# Patient Record
Sex: Female | Born: 1940 | Race: White | Hispanic: No | Marital: Married | Smoking: Never smoker
Health system: Southern US, Community
[De-identification: ages and names within clinical notes are randomized; demographics above are authoritative.]

## PROBLEM LIST (undated history)

## (undated) DIAGNOSIS — I10 Essential (primary) hypertension: Secondary | ICD-10-CM

## (undated) DIAGNOSIS — K219 Gastro-esophageal reflux disease without esophagitis: Secondary | ICD-10-CM

## (undated) HISTORY — PX: KNEE SURGERY: SHX244

---

## 2007-12-26 ENCOUNTER — Ambulatory Visit: Payer: Self-pay | Admitting: Internal Medicine

## 2008-01-17 ENCOUNTER — Ambulatory Visit: Payer: Self-pay | Admitting: Internal Medicine

## 2008-06-25 ENCOUNTER — Ambulatory Visit: Payer: Self-pay | Admitting: Internal Medicine

## 2008-12-28 ENCOUNTER — Ambulatory Visit: Payer: Self-pay | Admitting: Internal Medicine

## 2009-12-30 ENCOUNTER — Ambulatory Visit: Payer: Self-pay | Admitting: Internal Medicine

## 2010-02-14 ENCOUNTER — Ambulatory Visit: Payer: Self-pay | Admitting: Internal Medicine

## 2011-01-21 ENCOUNTER — Ambulatory Visit: Payer: Self-pay | Admitting: Internal Medicine

## 2012-01-28 ENCOUNTER — Ambulatory Visit: Payer: Self-pay | Admitting: Internal Medicine

## 2013-02-02 ENCOUNTER — Ambulatory Visit: Payer: Self-pay | Admitting: Internal Medicine

## 2013-11-22 ENCOUNTER — Ambulatory Visit: Payer: Self-pay | Admitting: Unknown Physician Specialty

## 2013-12-13 ENCOUNTER — Ambulatory Visit: Payer: Self-pay | Admitting: Unknown Physician Specialty

## 2014-08-17 ENCOUNTER — Other Ambulatory Visit: Payer: Self-pay | Admitting: Internal Medicine

## 2014-08-17 DIAGNOSIS — Z1231 Encounter for screening mammogram for malignant neoplasm of breast: Secondary | ICD-10-CM

## 2014-08-23 ENCOUNTER — Ambulatory Visit
Admission: RE | Admit: 2014-08-23 | Discharge: 2014-08-23 | Disposition: A | Discharge status: Home / Self Care | Payer: Medicare Other | Source: Ambulatory Visit | Attending: Internal Medicine | Admitting: Internal Medicine

## 2014-08-23 DIAGNOSIS — Z1231 Encounter for screening mammogram for malignant neoplasm of breast: Secondary | ICD-10-CM | POA: Diagnosis not present

## 2015-09-06 ENCOUNTER — Other Ambulatory Visit: Payer: Self-pay | Admitting: Internal Medicine

## 2015-09-06 DIAGNOSIS — Z1231 Encounter for screening mammogram for malignant neoplasm of breast: Secondary | ICD-10-CM

## 2015-09-17 ENCOUNTER — Ambulatory Visit: Admission: RE | Admit: 2015-09-17 | Payer: Medicare Other | Source: Ambulatory Visit

## 2016-09-02 ENCOUNTER — Ambulatory Visit
Admission: RE | Admit: 2016-09-02 | Discharge: 2016-09-02 | Disposition: A | Discharge status: Home / Self Care | Payer: Medicare Other | Source: Ambulatory Visit | Attending: Internal Medicine | Admitting: Internal Medicine

## 2016-09-02 DIAGNOSIS — Z1231 Encounter for screening mammogram for malignant neoplasm of breast: Secondary | ICD-10-CM | POA: Diagnosis present

## 2020-08-24 ENCOUNTER — Other Ambulatory Visit: Payer: Self-pay

## 2020-08-24 ENCOUNTER — Emergency Department: Payer: Medicare Other

## 2020-08-24 ENCOUNTER — Emergency Department
Admission: EM | Admit: 2020-08-24 | Discharge: 2020-08-24 | Disposition: A | Discharge status: Home / Self Care | Payer: Medicare Other | Attending: Emergency Medicine | Admitting: Emergency Medicine

## 2020-08-24 DIAGNOSIS — R6883 Chills (without fever): Secondary | ICD-10-CM | POA: Insufficient documentation

## 2020-08-24 DIAGNOSIS — R309 Painful micturition, unspecified: Secondary | ICD-10-CM | POA: Diagnosis not present

## 2020-08-24 DIAGNOSIS — R112 Nausea with vomiting, unspecified: Secondary | ICD-10-CM | POA: Insufficient documentation

## 2020-08-24 DIAGNOSIS — R1031 Right lower quadrant pain: Secondary | ICD-10-CM

## 2020-08-24 DIAGNOSIS — N201 Calculus of ureter: Secondary | ICD-10-CM

## 2020-08-24 LAB — URINALYSIS, COMPLETE (UACMP) WITH MICROSCOPIC
Bacteria, UA: NONE SEEN
Bilirubin Urine: NEGATIVE
Glucose, UA: NEGATIVE mg/dL
Ketones, ur: 20 mg/dL — AB
Nitrite: NEGATIVE
Protein, ur: 30 mg/dL — AB
Specific Gravity, Urine: 1.014 (ref 1.005–1.030)
pH: 5 (ref 5.0–8.0)

## 2020-08-24 LAB — CBC WITH DIFFERENTIAL/PLATELET
Abs Immature Granulocytes: 0.02 10*3/uL (ref 0.00–0.07)
Basophils Absolute: 0 10*3/uL (ref 0.0–0.1)
Basophils Relative: 0 %
Eosinophils Absolute: 0 10*3/uL (ref 0.0–0.5)
Eosinophils Relative: 0 %
HCT: 44.1 % (ref 36.0–46.0)
Hemoglobin: 14.8 g/dL (ref 12.0–15.0)
Immature Granulocytes: 0 %
Lymphocytes Relative: 9 %
Lymphs Abs: 0.9 10*3/uL (ref 0.7–4.0)
MCH: 28.9 pg (ref 26.0–34.0)
MCHC: 33.6 g/dL (ref 30.0–36.0)
MCV: 86.1 fL (ref 80.0–100.0)
Monocytes Absolute: 0.4 10*3/uL (ref 0.1–1.0)
Monocytes Relative: 4 %
Neutro Abs: 8.4 10*3/uL — ABNORMAL HIGH (ref 1.7–7.7)
Neutrophils Relative %: 87 %
Platelets: 249 10*3/uL (ref 150–400)
RBC: 5.12 MIL/uL — ABNORMAL HIGH (ref 3.87–5.11)
RDW: 13 % (ref 11.5–15.5)
WBC: 9.8 10*3/uL (ref 4.0–10.5)
nRBC: 0 % (ref 0.0–0.2)

## 2020-08-24 LAB — COMPREHENSIVE METABOLIC PANEL
ALT: 27 U/L (ref 0–44)
AST: 30 U/L (ref 15–41)
Albumin: 4.4 g/dL (ref 3.5–5.0)
Alkaline Phosphatase: 53 U/L (ref 38–126)
Anion gap: 11 (ref 5–15)
BUN: 18 mg/dL (ref 8–23)
CO2: 24 mmol/L (ref 22–32)
Calcium: 9.7 mg/dL (ref 8.9–10.3)
Chloride: 103 mmol/L (ref 98–111)
Creatinine, Ser: 0.91 mg/dL (ref 0.44–1.00)
GFR, Estimated: >60 mL/min (ref >60)
Glucose, Bld: 147 mg/dL — ABNORMAL HIGH (ref 70–99)
Potassium: 3.3 mmol/L — ABNORMAL LOW (ref 3.5–5.1)
Sodium: 138 mmol/L (ref 135–145)
Total Bilirubin: 0.8 mg/dL (ref 0.3–1.2)
Total Protein: 7.5 g/dL (ref 6.5–8.1)

## 2020-08-24 LAB — LIPASE, BLOOD: Lipase: 35 U/L (ref 11–51)

## 2020-08-24 MED ORDER — OXYCODONE-ACETAMINOPHEN 5-325 MG PO TABS
1.0000 | ORAL_TABLET | Freq: Four times a day (QID) | ORAL | 0 refills | Status: DC | PRN
Start: 2020-08-24 — End: 2020-10-15

## 2020-08-24 MED ORDER — KETOROLAC TROMETHAMINE 30 MG/ML IJ SOLN
15.0000 mg | INTRAMUSCULAR | Status: AC
  Administered 2020-08-24: 15 mg via INTRAVENOUS
  Filled 2020-08-24: qty 1

## 2020-08-24 MED ORDER — MORPHINE SULFATE (PF) 4 MG/ML IV SOLN
4.0000 mg | Freq: Once | INTRAVENOUS | Status: AC
  Administered 2020-08-24: 4 mg via INTRAVENOUS
  Filled 2020-08-24: qty 1

## 2020-08-24 MED ORDER — TAMSULOSIN HCL 0.4 MG PO CAPS
0.4000 mg | ORAL_CAPSULE | ORAL | Status: AC
  Administered 2020-08-24: 0.4 mg via ORAL
  Filled 2020-08-24: qty 1

## 2020-08-24 MED ORDER — ONDANSETRON HCL 4 MG/2ML IJ SOLN
4.0000 mg | Freq: Once | INTRAMUSCULAR | Status: AC
  Administered 2020-08-24: 4 mg via INTRAVENOUS
  Filled 2020-08-24: qty 2

## 2020-08-24 MED ORDER — TAMSULOSIN HCL 0.4 MG PO CAPS: 0.4000 mg | ORAL_CAPSULE | Freq: Every day | ORAL | 0 refills | Status: AC

## 2020-08-24 MED ORDER — IOHEXOL 350 MG/ML SOLN
80.0000 mL | Freq: Once | INTRAVENOUS | Status: AC | PRN
  Administered 2020-08-24: 80 mL via INTRAVENOUS

## 2020-08-24 MED ORDER — LACTATED RINGERS IV BOLUS
1000.0000 mL | Freq: Once | INTRAVENOUS | Status: AC
  Administered 2020-08-24: 1000 mL via INTRAVENOUS

## 2020-08-24 NOTE — ED Provider Notes (Signed)
[facility] Emergency Department Provider Note   ____________________________________________   Event Date/Time   First MD Initiated Contact with Patient 08/24/20 1830     (approximate)  I have reviewed the triage vital signs and the nursing notes.   HISTORY  Chief Complaint Flank Pain    HPI Hannah Heath is a 80 y.o. female with past medical history of hypertension, hyperlipidemia, and diabetes who presents to the ED complaining of abdominal pain.  Patient reports that she has been dealing with intermittent pain in the right lower quadrant of her abdomen that radiates towards her groin for the past 6 days.  She describes pain as sharp, not exacerbated or alleviated by anything in particular.  It has been associated with nausea and multiple episodes of vomiting.  She also endorses burning when she urinates but she denies any hematuria.  She had intermittent pain in her right flank around the time of onset of symptoms, but pain now seems to have localized more to her right lower quadrant.  She denies any history of kidney stones, but was told by her PCP earlier this week that they thought it might be related to a kidney stone.  She endorses chills, but has not taken her temperature at home.  She has not had any cough, chest pain, or shortness of breath.        History reviewed. No pertinent past medical history.  There are no problems to display for this patient.   History reviewed. No pertinent surgical history.  Prior to Admission medications   Not on File    Allergies Patient has no allergy information on record.  Family History  Problem Relation Age of Onset   Breast cancer Neg Hx     Social History    Review of Systems  Constitutional: Positive for subjective fever/chills Eyes: No visual changes. ENT: No sore throat. Cardiovascular: Denies chest pain. Respiratory: Denies shortness of breath. Gastrointestinal: Positive for flank  and abdominal pain.  Positive for nausea and vomiting.  No diarrhea.  No constipation. Genitourinary: Negative for dysuria. Musculoskeletal: Negative for back pain. Skin: Negative for rash. Neurological: Negative for headaches, focal weakness or numbness.  ____________________________________________   PHYSICAL EXAM:  VITAL SIGNS: ED Triage Vitals  Enc Vitals Group     BP 08/24/20 1825 (!) 191/87     Pulse Rate 08/24/20 1825 68     Resp 08/24/20 1825 18     Temp 08/24/20 1825 98.2 F (36.8 C)     Temp src --      SpO2 08/24/20 1825 100 %     Weight --      Height --      Head Circumference --      Peak Flow --      Pain Score 08/24/20 1823 10     Pain Loc --      Pain Edu? --      Excl. in GC? --     Constitutional: Alert and oriented. Eyes: Conjunctivae are normal. Head: Atraumatic. Nose: No congestion/rhinnorhea. Mouth/Throat: Mucous membranes are moist. Neck: Normal ROM Cardiovascular: Normal rate, regular rhythm. Grossly normal heart sounds. Respiratory: Normal respiratory effort.  No retractions. Lungs CTAB. Gastrointestinal: Soft and tender to palpation in the right lower quadrant with no rebound or guarding.  No CVA tenderness bilaterally. No distention. Genitourinary: deferred Musculoskeletal: No lower extremity tenderness nor edema. Neurologic:  Normal speech and language. No gross focal neurologic deficits are appreciated. Skin:  Skin is  warm, dry and intact. No rash noted. Psychiatric: Mood and affect are normal. Speech and behavior are normal.  ____________________________________________   LABS (all labs ordered are listed, but only abnormal results are displayed)  Labs Reviewed  CBC WITH DIFFERENTIAL/PLATELET - Abnormal; Notable for the following components:      Result Value   RBC 5.12 (*)    Neutro Abs 8.4 (*)    All other components within normal limits  COMPREHENSIVE METABOLIC PANEL - Abnormal; Notable for the following components:    Potassium 3.3 (*)    Glucose, Bld 147 (*)    All other components within normal limits  LIPASE, BLOOD  URINALYSIS, COMPLETE (UACMP) WITH MICROSCOPIC    PROCEDURES  Procedure(s) performed (including Critical Care):  Procedures   ____________________________________________   INITIAL IMPRESSION / ASSESSMENT AND PLAN / ED COURSE      80 year old female with past medical history of hypertension, hyperlipidemia, diabetes presents to the ED complaining of intermittent pain in her right flank initially, has since moved down to the right lower quadrant of her abdomen and radiates towards her groin.  She has focal tenderness in the right lower quadrant of her abdomen we will further assess with CT scan for appendicitis versus nephrolithiasis.  I would also consider pyelonephritis given her dysuria, although she has no CVA tenderness.  Vital signs are reassuring and not consistent with sepsis.  We will hydrate with IV fluids and treat symptomatically with IV morphine and Zofran.  Patient turned over to oncoming provider pending CT results and UA results.      ____________________________________________   FINAL CLINICAL IMPRESSION(S) / ED DIAGNOSES  Final diagnoses:  Right lower quadrant abdominal pain     ED Discharge Orders     None        Note:  This document was prepared using Dragon voice recognition software and may include unintentional dictation errors.    Chesley Noon, MD 08/24/20 [phone]

## 2020-08-24 NOTE — ED Provider Notes (Signed)
Procedures     ----------------------------------------- 10:40 PM on 08/24/2020 ----------------------------------------- CT scan shows a 3 mm kidney stone in the distal right ureter.  Labs are okay, no AKI, no urinary tract infection.  After repeat dose of pain medicine she is feeling much better and eager to be discharged home.  Will prescribe Percocet and Flomax.  She will continue to use NSAIDs at home and follow-up with urology this week.  Return precautions discussed.     Sharman Cheek, MD 08/24/20 2241

## 2020-08-24 NOTE — ED Notes (Signed)
Dr. Larinda Buttery at bedside assessing pt at this time.

## 2020-08-24 NOTE — ED Triage Notes (Signed)
Pt come with c/o right flank pain. Pt states this started Monday. Pt states severe pain, N/V.

## 2020-10-14 ENCOUNTER — Other Ambulatory Visit: Payer: Self-pay | Admitting: *Deleted

## 2020-10-14 DIAGNOSIS — N200 Calculus of kidney: Secondary | ICD-10-CM

## 2020-10-15 ENCOUNTER — Other Ambulatory Visit
Admission: RE | Admit: 2020-10-15 | Discharge: 2020-10-15 | Disposition: A | Discharge status: Home / Self Care | Payer: Medicare Other | Source: Home / Self Care | Attending: Urology | Admitting: Urology

## 2020-10-15 ENCOUNTER — Ambulatory Visit
Admission: RE | Admit: 2020-10-15 | Discharge: 2020-10-15 | Disposition: A | Discharge status: Home / Self Care | Payer: Medicare Other | Attending: Urology | Admitting: Urology

## 2020-10-15 ENCOUNTER — Ambulatory Visit: Payer: Medicare Other | Admitting: Urology

## 2020-10-15 ENCOUNTER — Encounter: Payer: Self-pay | Admitting: Urology

## 2020-10-15 ENCOUNTER — Ambulatory Visit: Payer: Self-pay | Admitting: Urology

## 2020-10-15 ENCOUNTER — Ambulatory Visit
Admission: RE | Admit: 2020-10-15 | Discharge: 2020-10-15 | Disposition: A | Discharge status: Home / Self Care | Payer: Medicare Other | Source: Ambulatory Visit | Attending: Urology | Admitting: Urology

## 2020-10-15 ENCOUNTER — Other Ambulatory Visit: Payer: Self-pay

## 2020-10-15 DIAGNOSIS — N200 Calculus of kidney: Secondary | ICD-10-CM

## 2020-10-15 LAB — URINALYSIS, COMPLETE (UACMP) WITH MICROSCOPIC
Bilirubin Urine: NEGATIVE
Glucose, UA: NEGATIVE mg/dL
Leukocytes,Ua: NEGATIVE
Nitrite: NEGATIVE
Protein, ur: NEGATIVE mg/dL
Specific Gravity, Urine: 1.02 (ref 1.005–1.030)
pH: 5 (ref 5.0–8.0)

## 2020-10-15 NOTE — Progress Notes (Signed)
   10/15/20 1:07 PM   Lambert Keto Nov 22, 1940 [id number]  CC: Nephrolithiasis, possible bladder lesion  HPI: 80 year old female with history of kidney stones who presented to the ER on 08/24/2020 with right-sided flank pain and nausea.  I personally viewed and interpreted the CT that showed a 3 mm right distal ureteral stone with severe upstream hydronephrosis, as well as mild left hydronephrosis secondary to a small 1 to 2 mm left distal ureteral stone.  This was not commented on the original CT report.  The CT also comments on some thickening of the right sided bladder wall of unclear etiology.  Within a few days of that ER visit her pain totally resolved.  She never saw a stone pass.  She denies any hematuria or urinary symptoms.  Urinalysis today is completely benign.  She continues to urinate without issue.  PMH: No past medical history on file.  Surgical History: No past surgical history on file.     Family History: Family History  Problem Relation Age of Onset   Breast cancer Neg Hx     Social History:  reports that she has never smoked. She has never used smokeless tobacco. She reports that she does not drink alcohol and does not use drugs.  Physical Exam:  Constitutional:  Alert and oriented, No acute distress. Cardiovascular: No clubbing, cyanosis, or edema. Respiratory: Normal respiratory effort, no increased work of breathing. GI: Abdomen is soft, nontender, nondistended, no abdominal masses   Laboratory Data: Reviewed, see HPI  Pertinent Imaging: I have personally viewed and interpreted the CT showing a 3 mm right distal ureteral stone with upstream hydronephrosis, as well as a smaller 2 mm left distal ureteral stone with upstream hydronephrosis.  KUB today with multiple phleboliths, but no definite evidence of ureteral stone.  Assessment & Plan:   80 year old female with likely spontaneous passage of bilateral ureteral stones 6 weeks ago.  She is  asymptomatic today and urinalysis is completely benign.  I recommended a renal ultrasound to confirm resolution of hydronephrosis as she was not having significant pain on the left side originally, and we will call with those results.  Regarding the soft tissue thickening of the posterior bladder wall on the right side this almost certainly represents edema/inflammation from the acute right distal ureteral stone, and will also be reexamined at the follow-up bladder ultrasound.  Renal/bladder ultrasound, call with results-> if normal can follow-up with urology as needed   Legrand Rams, MD 10/15/2020  [facility] [address] [place], [place] [address] [phone]

## 2020-10-15 NOTE — Patient Instructions (Signed)
Dietary Guidelines to Help Prevent Kidney Stones Kidney stones are deposits of minerals and salts that form inside your kidneys. Your risk of developing kidney stones may be greater depending on your diet, your lifestyle, the medicines you take, and whether you have certain medical conditions. Most people can lower their chances of developing kidney stones by following the instructions below. Your dietitian may give you more specific instructions depending on your overall health and the type of kidney stones you tend to develop. What are tips for following this plan? Reading food labels  Choose foods with "no salt added" or "low-salt" labels. Limit your salt (sodium) intake to less than 1,500 mg a day. Choose foods with calcium for each meal and snack. Try to eat about 300 mg of calcium at each meal. Foods that contain 200-500 mg of calcium a serving include: 8 oz (237 mL) of milk, calcium-fortifiednon-dairy milk, and calcium-fortifiedfruit juice. Calcium-fortified means that calcium has been added to these drinks. 8 oz (237 mL) of kefir, yogurt, and soy yogurt. 4 oz (114 g) of tofu. 1 oz (28 g) of cheese. 1 cup (150 g) of dried figs. 1 cup (91 g) of cooked broccoli. One 3 oz (85 g) can of sardines or mackerel. Most people need 1,000-1,500 mg of calcium a day. Talk to your dietitian about how much calcium is recommended for you. Shopping Buy plenty of fresh fruits and vegetables. Most people do not need to avoid fruits and vegetables, even if these foods contain nutrients that may contribute to kidney stones. When shopping for convenience foods, choose: Whole pieces of fruit. Pre-made salads with dressing on the side. Low-fat fruit and yogurt smoothies. Avoid buying frozen meals or prepared deli foods. These can be high in sodium. Look for foods with live cultures, such as yogurt and kefir. Choose high-fiber grains, such as whole-wheat breads, oat bran, and wheat cereals. Cooking Do not add  salt to food when cooking. Place a salt shaker on the table and allow each person to add his or her own salt to taste. Use vegetable protein, such as beans, textured vegetable protein (TVP), or tofu, instead of meat in pasta, casseroles, and soups. Meal planning Eat less salt, if told by your dietitian. To do this: Avoid eating processed or pre-made food. Avoid eating fast food. Eat less animal protein, including cheese, meat, poultry, or fish, if told by your dietitian. To do this: Limit the number of times you have meat, poultry, fish, or cheese each week. Eat a diet free of meat at least 2 days a week. Eat only one serving each day of meat, poultry, fish, or seafood. When you prepare animal protein, cut pieces into small portion sizes. For most meat and fish, one serving is about the size of the palm of your hand. Eat at least five servings of fresh fruits and vegetables each day. To do this: Keep fruits and vegetables on hand for snacks. Eat one piece of fruit or a handful of berries with breakfast. Have a salad and fruit at lunch. Have two kinds of vegetables at dinner. Limit foods that are high in a substance called oxalate. These include: Spinach (cooked), rhubarb, beets, sweet potatoes, and Swiss chard. Peanuts. Potato chips, french fries, and baked potatoes with skin on. Nuts and nut products. Chocolate. If you regularly take a diuretic medicine, make sure to eat at least 1 or 2 servings of fruits or vegetables that are high in potassium each day. These include: Avocado. Banana. Orange, prune,   carrot, or tomato juice. Baked potato. Cabbage. Beans and split peas. Lifestyle  Drink enough fluid to keep your urine pale yellow. This is the most important thing you can do. Spread your fluid intake throughout the day. If you drink alcohol: Limit how much you use to: 0-1 drink a day for women who are not pregnant. 0-2 drinks a day for men. Be aware of how much alcohol is in your  drink. In the [place] one drink equals one 12 oz bottle of beer (355 mL), one 5 oz glass of wine (148 mL), or one 1 oz glass of hard liquor (44 mL). Lose weight if told by your health care provider. Work with your dietitian to find an eating plan and weight loss strategies that work best for you. General information Talk to your health care provider and dietitian about taking daily supplements. You may be told the following depending on your health and the cause of your kidney stones: Not to take supplements with vitamin C. To take a calcium supplement. To take a daily probiotic supplement. To take other supplements such as magnesium, fish oil, or vitamin B6. Take over-the-counter and prescription medicines only as told by your health care provider. These include supplements. What foods should I limit? Limit your intake of the following foods, or eat them as told by your dietitian. Vegetables Spinach. Rhubarb. Beets. Canned vegetables. Pickles. Olives. Baked potatoes with skin. Grains Wheat bran. Baked goods. Salted crackers. Cereals high in sugar. Meats and other proteins Nuts. Nut butters. Large portions of meat, poultry, or fish. Salted, precooked, or cured meats, such as sausages, meat loaves, and hot dogs. Dairy Cheese. Beverages Regular soft drinks. Regular vegetable juice. Seasonings and condiments Seasoning blends with salt. Salad dressings. Soy sauce. Ketchup. Barbecue sauce. Other foods Canned soups. Canned pasta sauce. Casseroles. Pizza. Lasagna. Frozen meals. Potato chips. French fries. The items listed above may not be a complete list of foods and beverages you should limit. Contact a dietitian for more information. What foods should I avoid? Talk to your dietitian about specific foods you should avoid based on the type of kidney stones you have and your overall health. Fruits Grapefruit. The item listed above may not be a complete list of foods and beverages you should  avoid. Contact a dietitian for more information. Summary Kidney stones are deposits of minerals and salts that form inside your kidneys. You can lower your risk of kidney stones by making changes to your diet. The most important thing you can do is drink enough fluid. Drink enough fluid to keep your urine pale yellow. Talk to your dietitian about how much calcium you should have each day, and eat less salt and animal protein as told by your dietitian. This information is not intended to replace advice given to you by your health care provider. Make sure you discuss any questions you have with your health care provider. Document Revised: 12/22/2018 Document Reviewed: 12/22/2018 Elsevier Patient Education  2022 Elsevier Inc.  

## 2020-11-08 ENCOUNTER — Ambulatory Visit
Admission: RE | Admit: 2020-11-08 | Discharge: 2020-11-08 | Disposition: A | Discharge status: Home / Self Care | Payer: Medicare Other | Source: Ambulatory Visit | Attending: Urology | Admitting: Urology

## 2020-11-08 ENCOUNTER — Other Ambulatory Visit: Payer: Self-pay

## 2020-11-08 DIAGNOSIS — N200 Calculus of kidney: Secondary | ICD-10-CM | POA: Insufficient documentation

## 2020-11-12 ENCOUNTER — Telehealth: Payer: Self-pay

## 2020-11-12 NOTE — Telephone Encounter (Signed)
-----   Message from Sondra Come, MD sent at 11/12/2020  8:19 AM EDT ----- Good news, kidney and bladder US normal, can follow up with urology as needed  Legrand Rams, MD 11/12/2020

## 2020-11-12 NOTE — Telephone Encounter (Signed)
Called pt informed her of the information below. Pt voiced understanding.  

## 2021-07-07 ENCOUNTER — Other Ambulatory Visit: Payer: Self-pay | Admitting: Internal Medicine

## 2021-07-07 DIAGNOSIS — Z1231 Encounter for screening mammogram for malignant neoplasm of breast: Secondary | ICD-10-CM

## 2021-07-18 ENCOUNTER — Other Ambulatory Visit: Payer: Self-pay | Admitting: Internal Medicine

## 2021-07-18 DIAGNOSIS — E2839 Other primary ovarian failure: Secondary | ICD-10-CM

## 2021-07-20 LAB — COLOGUARD: COLOGUARD: NEGATIVE

## 2021-08-15 ENCOUNTER — Other Ambulatory Visit: Payer: Medicare Other

## 2022-01-08 ENCOUNTER — Encounter: Payer: Self-pay | Admitting: Ophthalmology

## 2022-01-19 NOTE — Discharge Instructions (Signed)

## 2022-01-20 ENCOUNTER — Ambulatory Visit
Admission: RE | Admit: 2022-01-20 | Discharge: 2022-01-20 | Disposition: A | Discharge status: Home / Self Care | Payer: Medicare Other | Attending: Ophthalmology | Admitting: Ophthalmology

## 2022-01-20 ENCOUNTER — Ambulatory Visit: Payer: Medicare Other | Admitting: Anesthesiology

## 2022-01-20 ENCOUNTER — Encounter: Payer: Self-pay | Admitting: Ophthalmology

## 2022-01-20 ENCOUNTER — Encounter
Admission: RE | Disposition: A | Discharge status: Home / Self Care | Payer: Self-pay | Source: Home / Self Care | Attending: Ophthalmology

## 2022-01-20 ENCOUNTER — Other Ambulatory Visit: Payer: Self-pay

## 2022-01-20 DIAGNOSIS — K219 Gastro-esophageal reflux disease without esophagitis: Secondary | ICD-10-CM | POA: Diagnosis not present

## 2022-01-20 DIAGNOSIS — Z79899 Other long term (current) drug therapy: Secondary | ICD-10-CM | POA: Diagnosis not present

## 2022-01-20 DIAGNOSIS — I10 Essential (primary) hypertension: Secondary | ICD-10-CM | POA: Insufficient documentation

## 2022-01-20 DIAGNOSIS — H2511 Age-related nuclear cataract, right eye: Secondary | ICD-10-CM | POA: Diagnosis not present

## 2022-01-20 HISTORY — DX: Essential (primary) hypertension: I10

## 2022-01-20 HISTORY — PX: CATARACT EXTRACTION W/PHACO: SHX586

## 2022-01-20 HISTORY — DX: Gastro-esophageal reflux disease without esophagitis: K21.9

## 2022-01-20 SURGERY — PHACOEMULSIFICATION, CATARACT, WITH IOL INSERTION
Anesthesia: Monitor Anesthesia Care | Site: Eye | Laterality: Right

## 2022-01-20 MED ORDER — FENTANYL CITRATE (PF) 100 MCG/2ML IJ SOLN
INTRAMUSCULAR | Status: DC | PRN
  Administered 2022-01-20: 50 ug via INTRAVENOUS

## 2022-01-20 MED ORDER — SIGHTPATH DOSE#1 BSS IO SOLN
INTRAOCULAR | Status: DC | PRN
  Administered 2022-01-20: 2 mL

## 2022-01-20 MED ORDER — FENTANYL CITRATE PF 50 MCG/ML IJ SOSY: 25.0000 ug | PREFILLED_SYRINGE | INTRAMUSCULAR | Status: DC | PRN

## 2022-01-20 MED ORDER — LACTATED RINGERS IV SOLN: INTRAVENOUS | Status: DC

## 2022-01-20 MED ORDER — SIGHTPATH DOSE#1 BSS IO SOLN
INTRAOCULAR | Status: DC | PRN
  Administered 2022-01-20: 94 mL via OPHTHALMIC

## 2022-01-20 MED ORDER — MIDAZOLAM HCL 2 MG/2ML IJ SOLN
INTRAMUSCULAR | Status: DC | PRN
  Administered 2022-01-20: 1 mg via INTRAVENOUS

## 2022-01-20 MED ORDER — ONDANSETRON HCL 4 MG/2ML IJ SOLN: 4.0000 mg | Freq: Once | INTRAMUSCULAR | Status: DC | PRN

## 2022-01-20 MED ORDER — ARMC OPHTHALMIC DILATING DROPS
1.0000 | OPHTHALMIC | Status: DC | PRN
  Administered 2022-01-20 (×3): 1 via OPHTHALMIC

## 2022-01-20 MED ORDER — TETRACAINE HCL 0.5 % OP SOLN
1.0000 [drp] | OPHTHALMIC | Status: DC | PRN
  Administered 2022-01-20 (×3): 1 [drp] via OPHTHALMIC

## 2022-01-20 MED ORDER — MOXIFLOXACIN HCL 0.5 % OP SOLN
OPHTHALMIC | Status: DC | PRN
  Administered 2022-01-20: .2 mL via OPHTHALMIC

## 2022-01-20 MED ORDER — BRIMONIDINE TARTRATE-TIMOLOL 0.2-0.5 % OP SOLN
OPHTHALMIC | Status: DC | PRN
  Administered 2022-01-20: 1 [drp] via OPHTHALMIC

## 2022-01-20 MED ORDER — SIGHTPATH DOSE#1 NA CHONDROIT SULF-NA HYALURON 40-17 MG/ML IO SOLN
INTRAOCULAR | Status: DC | PRN
  Administered 2022-01-20: 1 mL via INTRAOCULAR

## 2022-01-20 MED ORDER — SIGHTPATH DOSE#1 BSS IO SOLN
INTRAOCULAR | Status: DC | PRN
  Administered 2022-01-20: 15 mL via INTRAOCULAR

## 2022-01-20 SURGICAL SUPPLY — 11 items
CANNULA ANT/CHMB 27G (MISCELLANEOUS) IMPLANT
CANNULA ANT/CHMB 27GA (MISCELLANEOUS) IMPLANT
CATARACT SUITE SIGHTPATH (MISCELLANEOUS) ×1 IMPLANT
FEE CATARACT SUITE SIGHTPATH (MISCELLANEOUS) ×1 IMPLANT
GLOVE SURG ENC TEXT LTX SZ8 (GLOVE) ×1 IMPLANT
GLOVE SURG TRIUMPH 8.0 PF LTX (GLOVE) ×1 IMPLANT
LENS IOL TECNIS EYHANCE 16.5 (Intraocular Lens) IMPLANT
NDL FILTER BLUNT 18X1 1/2 (NEEDLE) ×1 IMPLANT
NEEDLE FILTER BLUNT 18X1 1/2 (NEEDLE) ×1 IMPLANT
SYR 3ML LL SCALE MARK (SYRINGE) ×1 IMPLANT
WATER STERILE IRR 250ML POUR (IV SOLUTION) ×1 IMPLANT

## 2022-01-20 NOTE — H&P (Signed)
[facility]   Primary Care Physician:  Albina Billet, MD Ophthalmologist: Dr. George Ina  Pre-Procedure History & Physical: HPI:  Hannah Heath is a 82 y.o. female here for cataract surgery.   Past Medical History:  Diagnosis Date   GERD (gastroesophageal reflux disease)    Hypertension     Past Surgical History:  Procedure Laterality Date   KNEE SURGERY Right     Prior to Admission medications   Medication Sig Start Date End Date Taking? Authorizing Provider  cetirizine (ZYRTEC) 10 MG tablet Take 5 mg by mouth daily.   Yes [provider]  diltiazem (CARDIZEM CD) 240 MG 24 hr capsule Take 240 mg by mouth daily. 09/20/20  Yes [provider]  fluticasone (FLONASE) 50 MCG/ACT nasal spray SMARTSIG:1 Both Nares Daily PRN 10/07/20  Yes [provider]  ibuprofen (ADVIL) 200 MG tablet Take 400 mg by mouth at bedtime.   Yes [provider]  lisinopril (ZESTRIL) 40 MG tablet Take 40 mg by mouth daily. 09/23/20  Yes [provider]  Melatonin 10 MG TABS Take by mouth at bedtime.   Yes [provider]  Multiple Vitamin (MULTIVITAMIN) tablet Take 1 tablet by mouth daily. Nature's truth   Yes [provider]  omeprazole (PRILOSEC) 20 MG capsule Take 20 mg by mouth daily.   Yes [provider]  pravastatin (PRAVACHOL) 40 MG tablet Take 40 mg by mouth daily as needed. 09/20/20  Yes [provider]    Allergies as of 12/15/2021 - Review Complete 10/15/2020  Allergen Reaction Noted   Codeine Itching 05/31/2013    Family History  Problem Relation Age of Onset   Breast cancer Neg Hx     Social History   Socioeconomic History   Marital status: Married    Spouse name: Not on file   Number of children: Not on file   Years of education: Not on file   Highest education level: Not on file  Occupational History   Not on file  Tobacco Use   Smoking status: Never   Smokeless tobacco: Never  Vaping Use    Vaping Use: Never used  Substance and Sexual Activity   Alcohol use: Never   Drug use: Never   Sexual activity: Not Currently  Other Topics Concern   Not on file  Social History Narrative   Not on file   Social Determinants of Health   Financial Resource Strain: Not on file  Food Insecurity: Not on file  Transportation Needs: Not on file  Physical Activity: Not on file  Stress: Not on file  Social Connections: Not on file  Intimate Partner Violence: Not on file    Review of Systems: See HPI, otherwise negative ROS  Physical Exam: BP (!) 165/94   Pulse 70   Temp (!) 97.4 F (36.3 C) (Temporal)   Resp 16   Ht 4\' 11"  (1.499 m)   Wt 72.4 kg   SpO2 97%   BMI 32.26 kg/m  General:   Alert, cooperative in NAD Head:  Normocephalic and atraumatic. Respiratory:  Normal work of breathing. Cardiovascular:  RRR  Impression/Plan: Hannah Heath is here for cataract surgery.  Risks, benefits, limitations, and alternatives regarding cataract surgery have been reviewed with the patient.  Questions have been answered.  All parties agreeable.   Birder Robson, MD  01/20/2022, 9:08 AM

## 2022-01-20 NOTE — Transfer of Care (Signed)
Immediate Anesthesia Transfer of Care Note  Patient: Hannah Heath  Procedure(s) Performed: CATARACT EXTRACTION PHACO AND INTRAOCULAR LENS PLACEMENT (IOC) RIGHT 27.55 02:16.7 (Right: Eye)  Patient Location: PACU  Anesthesia Type:MAC  Level of Consciousness: awake, alert , and oriented  Airway & Oxygen Therapy: Patient Spontanous Breathing  Post-op Assessment: Report given to RN  Post vital signs: Reviewed and stable  Last Vitals:  Vitals Value Taken Time  BP 144/70 01/20/22 0938  Temp 36.3 C 01/20/22 0938  Pulse 70 01/20/22 0940  Resp 17 01/20/22 0940  SpO2 96 % 01/20/22 0940  Vitals shown include unvalidated device data.  Last Pain:  Vitals:   01/20/22 0938  TempSrc:   PainSc: 0-No pain      Patients Stated Pain Goal: 0 (62/56/38 9373)  Complications: No notable events documented.

## 2022-01-20 NOTE — Anesthesia Preprocedure Evaluation (Signed)
Anesthesia Evaluation  Patient identified by MRN, date of birth, ID band Patient awake    Reviewed: Allergy & Precautions, H&P , NPO status , Patient's Chart, lab work & pertinent test results, reviewed documented beta blocker date and time   Airway Mallampati: II  TM Distance: >3 FB Neck ROM: full    Dental no notable dental hx. (+) Teeth Intact   Pulmonary neg pulmonary ROS   Pulmonary exam normal breath sounds clear to auscultation       Cardiovascular Exercise Tolerance: Good hypertension, On Medications negative cardio ROS  Rhythm:regular Rate:Normal     Neuro/Psych negative neurological ROS  negative psych ROS   GI/Hepatic Neg liver ROS,GERD  Medicated,,  Endo/Other  negative endocrine ROSdiabetes, Well Controlled    Renal/GU      Musculoskeletal   Abdominal   Peds  Hematology negative hematology ROS (+)   Anesthesia Other Findings   Reproductive/Obstetrics negative OB ROS                             Anesthesia Physical Anesthesia Plan  ASA: 2  Anesthesia Plan: MAC   Post-op Pain Management:    Induction:   PONV Risk Score and Plan:   Airway Management Planned:   Additional Equipment:   Intra-op Plan:   Post-operative Plan:   Informed Consent: I have reviewed the patients History and Physical, chart, labs and discussed the procedure including the risks, benefits and alternatives for the proposed anesthesia with the patient or authorized representative who has indicated his/her understanding and acceptance.       Plan Discussed with: CRNA  Anesthesia Plan Comments:        Anesthesia Quick Evaluation  

## 2022-01-20 NOTE — Anesthesia Postprocedure Evaluation (Signed)
Anesthesia Post Note  Patient: Hannah Heath  Procedure(s) Performed: CATARACT EXTRACTION PHACO AND INTRAOCULAR LENS PLACEMENT (IOC) RIGHT 27.55 02:16.7 (Right: Eye)  Patient location during evaluation: PACU Anesthesia Type: MAC Level of consciousness: awake and alert Pain management: pain level controlled Vital Signs Assessment: post-procedure vital signs reviewed and stable Respiratory status: spontaneous breathing, nonlabored ventilation, respiratory function stable and patient connected to nasal cannula oxygen Cardiovascular status: stable and blood pressure returned to baseline Postop Assessment: no apparent nausea or vomiting Anesthetic complications: no   No notable events documented.   Last Vitals:  Vitals:   01/20/22 0938 01/20/22 0942  BP: (!) 144/70 (!) 143/80  Pulse: 65 65  Resp: 15 20  Temp: (!) 36.3 C (!) 36.3 C  SpO2: 97% 95%    Last Pain:  Vitals:   01/20/22 0942  TempSrc:   PainSc: 0-No pain                 Molli Barrows

## 2022-01-20 NOTE — Op Note (Signed)
PREOPERATIVE DIAGNOSIS:  Nuclear sclerotic cataract of the right eye.   POSTOPERATIVE DIAGNOSIS:  Cataract   OPERATIVE PROCEDURE:ORPROCALL@   SURGEON:  Birder Robson, MD.   ANESTHESIA:  Anesthesiologist: Molli Barrows, MD  1.      Managed anesthesia care. 2.      0.47ml of Shugarcaine was instilled in the eye following the paracentesis.   COMPLICATIONS:  None.   TECHNIQUE:   Stop and chop   DESCRIPTION OF PROCEDURE:  The patient was examined and consented in the preoperative holding area where the aforementioned topical anesthesia was applied to the right eye and then brought back to the Operating Room where the right eye was prepped and draped in the usual sterile ophthalmic fashion and a lid speculum was placed. A paracentesis was created with the side port blade and the anterior chamber was filled with viscoelastic. A near clear corneal incision was performed with the steel keratome. A continuous curvilinear capsulorrhexis was performed with a cystotome followed by the capsulorrhexis forceps. Hydrodissection and hydrodelineation were carried out with BSS on a blunt cannula. The lens was removed in a stop and chop  technique and the remaining cortical material was removed with the irrigation-aspiration handpiece. The capsular bag was inflated with viscoelastic and the Technis ZCB00  lens was placed in the capsular bag without complication. The remaining viscoelastic was removed from the eye with the irrigation-aspiration handpiece. The wounds were hydrated. The anterior chamber was flushed with BSS and the eye was inflated to physiologic pressure. 0.38ml of Vigamox was placed in the anterior chamber. The wounds were found to be water tight. The eye was dressed with Combigan. The patient was given protective glasses to wear throughout the day and a shield with which to sleep tonight. The patient was also given drops with which to begin a drop regimen today and will follow-up with me in one  day. Implant Name Type Inv. Item Serial No. Manufacturer Lot No. LRB No. Used Action  LENS IOL TECNIS EYHANCE 16.5 - [id number] Intraocular Lens LENS IOL TECNIS EYHANCE 16.5 [id number] [employer]  Right 1 Implanted   Procedure(s): CATARACT EXTRACTION PHACO AND INTRAOCULAR LENS PLACEMENT (IOC) RIGHT 27.55 02:16.7 (Right)  Electronically signed: Birder Robson 01/20/2022 9:36 AM

## 2022-01-20 NOTE — Anesthesia Postprocedure Evaluation (Signed)
Anesthesia Post Note  Patient: Cathi F Stangelo  Procedure(s) Performed: CATARACT EXTRACTION PHACO AND INTRAOCULAR LENS PLACEMENT (IOC) RIGHT 27.55 02:16.7 (Right: Eye)  Patient location during evaluation: PACU Anesthesia Type: MAC Level of consciousness: awake and alert Pain management: pain level controlled Vital Signs Assessment: post-procedure vital signs reviewed and stable Respiratory status: spontaneous breathing, nonlabored ventilation, respiratory function stable and patient connected to nasal cannula oxygen Cardiovascular status: stable and blood pressure returned to baseline Postop Assessment: no apparent nausea or vomiting Anesthetic complications: no   No notable events documented.   Last Vitals:  Vitals:   01/20/22 0938 01/20/22 0942  BP: (!) 144/70 (!) 143/80  Pulse: 65 65  Resp: 15 20  Temp: (!) 36.3 C (!) 36.3 C  SpO2: 97% 95%    Last Pain:  Vitals:   01/20/22 0942  TempSrc:   PainSc: 0-No pain                 Marilena Trevathan G Aalyiah Camberos      

## 2022-01-20 NOTE — Anesthesia Postprocedure Evaluation (Deleted)
Anesthesia Post Note  Patient: Hannah Heath  Procedure(s) Performed: CATARACT EXTRACTION PHACO AND INTRAOCULAR LENS PLACEMENT (IOC) RIGHT 27.55 02:16.7 (Right: Eye)  Anesthesia Type: MAC Anesthetic complications: no   No notable events documented.   Last Vitals:  Vitals:   01/20/22 0938 01/20/22 0942  BP: (!) 144/70 (!) 143/80  Pulse: 65 65  Resp: 15 20  Temp: (!) 36.3 C (!) 36.3 C  SpO2: 97% 95%    Last Pain:  Vitals:   01/20/22 0942  TempSrc:   PainSc: 0-No pain                 Molli Barrows

## 2022-01-21 ENCOUNTER — Encounter: Payer: Self-pay | Admitting: Ophthalmology

## 2022-02-02 NOTE — Discharge Instructions (Signed)
   Cataract Surgery, Care After ? ?This sheet gives you information about how to care for yourself after your surgery.  Your ophthalmologist may also give you more specific instructions.  If you have problems or questions, contact your doctor at [facility], [phone]. ? ?What can I expect after the surgery? ?It is common to have: ?Itching ?Foreign body sensation (feels like a grain of sand in the eye) ?Watery discharge (excess tearing) ?Sensitivity to light and touch ?Bruising in or around the eye ?Mild blurred vision ? ?Follow these instructions at home: ?Do not touch or rub your eyes. ?You may be told to wear a protective shield or sunglasses to protect your eyes. ?Do not put a contact lens in the operative eye unless your doctor approves. ?Keep the lids and face clean and dry. ?Do not allow water to hit you directly in the face while showering. ?Keep soap and shampoo out of your eyes. ?Do not use eye makeup for 1 week. ? ?Check your eye every day for signs of infection.  Watch for: ?Redness, swelling, or pain. ?Fluid, blood or pus. ?Worsening vision. ?Worsening sensitivity to light or touch. ? ?Activity: ?During the first day, avoid bending over and reading.  You may resume reading and bending the next day. ?Do not drive or use heavy machinery for at least 24 hours. ?Avoid strenuous activities for 1 week.  Activities such as walking, treadmill, exercise bike, and climbing stairs are okay. ?Do not lift heavy (>20 pound) objects for 1 week. ?Do not do yardwork, gardening, or dirty housework (mopping, cleaning bathrooms, vacuuming, etc.) for 1 week. ?Do not swim or use a hot tub for 2 weeks. ?Ask your doctor when you can return to work. ? ?General Instructions: ?Take or apply prescription and over-the-counter medicines as directed by your doctor, including eyedrops and ointments. ?Resume medications discontinued prior to surgery, unless told otherwise by your doctor. ?Keep all follow up appointments as  scheduled. ? ?Contact a health care provider if: ?You have increased bruising around your eye. ?You have pain that is not helped with medication. ?You have a fever. ?You have fluid, pus, or blood coming from your eye or incision. ?Your sensitivity to light gets worse. ?You have spots (floaters) of flashing lights in your vision. ?You have nausea or vomiting. ? ?Go to the nearest emergency room or call 911 if: ?You have sudden loss of vision. ?You have severe, worsening eye pain. ? ?

## 2022-02-03 ENCOUNTER — Other Ambulatory Visit: Payer: Self-pay

## 2022-02-03 ENCOUNTER — Ambulatory Visit
Admission: RE | Admit: 2022-02-03 | Discharge: 2022-02-03 | Disposition: A | Discharge status: Home / Self Care | Payer: Medicare Other | Attending: Ophthalmology | Admitting: Ophthalmology

## 2022-02-03 ENCOUNTER — Ambulatory Visit: Payer: Medicare Other | Admitting: Anesthesiology

## 2022-02-03 ENCOUNTER — Encounter: Payer: Self-pay | Admitting: Ophthalmology

## 2022-02-03 ENCOUNTER — Encounter
Admission: RE | Disposition: A | Discharge status: Home / Self Care | Payer: Self-pay | Source: Home / Self Care | Attending: Ophthalmology

## 2022-02-03 DIAGNOSIS — H2512 Age-related nuclear cataract, left eye: Secondary | ICD-10-CM | POA: Insufficient documentation

## 2022-02-03 DIAGNOSIS — E669 Obesity, unspecified: Secondary | ICD-10-CM | POA: Insufficient documentation

## 2022-02-03 DIAGNOSIS — K219 Gastro-esophageal reflux disease without esophagitis: Secondary | ICD-10-CM | POA: Insufficient documentation

## 2022-02-03 DIAGNOSIS — M1990 Unspecified osteoarthritis, unspecified site: Secondary | ICD-10-CM | POA: Insufficient documentation

## 2022-02-03 DIAGNOSIS — E119 Type 2 diabetes mellitus without complications: Secondary | ICD-10-CM | POA: Diagnosis not present

## 2022-02-03 DIAGNOSIS — Z6832 Body mass index (BMI) 32.0-32.9, adult: Secondary | ICD-10-CM | POA: Insufficient documentation

## 2022-02-03 DIAGNOSIS — I10 Essential (primary) hypertension: Secondary | ICD-10-CM | POA: Insufficient documentation

## 2022-02-03 HISTORY — PX: CATARACT EXTRACTION W/PHACO: SHX586

## 2022-02-03 SURGERY — PHACOEMULSIFICATION, CATARACT, WITH IOL INSERTION
Anesthesia: Monitor Anesthesia Care | Site: Eye | Laterality: Left

## 2022-02-03 MED ORDER — MOXIFLOXACIN HCL 0.5 % OP SOLN
OPHTHALMIC | Status: DC | PRN
  Administered 2022-02-03: .2 mL via OPHTHALMIC

## 2022-02-03 MED ORDER — SIGHTPATH DOSE#1 NA CHONDROIT SULF-NA HYALURON 40-17 MG/ML IO SOLN
INTRAOCULAR | Status: DC | PRN
  Administered 2022-02-03: 1 mL via INTRAOCULAR

## 2022-02-03 MED ORDER — FENTANYL CITRATE (PF) 100 MCG/2ML IJ SOLN
INTRAMUSCULAR | Status: DC | PRN
  Administered 2022-02-03: 50 ug via INTRAVENOUS

## 2022-02-03 MED ORDER — SIGHTPATH DOSE#1 BSS IO SOLN
INTRAOCULAR | Status: DC | PRN
  Administered 2022-02-03: 15 mL via INTRAOCULAR

## 2022-02-03 MED ORDER — TETRACAINE HCL 0.5 % OP SOLN
1.0000 [drp] | OPHTHALMIC | Status: DC | PRN
  Administered 2022-02-03 (×3): 1 [drp] via OPHTHALMIC

## 2022-02-03 MED ORDER — ONDANSETRON HCL 4 MG/2ML IJ SOLN: 4.0000 mg | Freq: Once | INTRAMUSCULAR | Status: DC | PRN

## 2022-02-03 MED ORDER — MIDAZOLAM HCL 2 MG/2ML IJ SOLN
INTRAMUSCULAR | Status: DC | PRN
  Administered 2022-02-03: 1 mg via INTRAVENOUS

## 2022-02-03 MED ORDER — SIGHTPATH DOSE#1 BSS IO SOLN
INTRAOCULAR | Status: DC | PRN
  Administered 2022-02-03: 55 mL via OPHTHALMIC

## 2022-02-03 MED ORDER — SIGHTPATH DOSE#1 BSS IO SOLN
INTRAOCULAR | Status: DC | PRN
  Administered 2022-02-03: 2 mL

## 2022-02-03 MED ORDER — LACTATED RINGERS IV SOLN: INTRAVENOUS | Status: DC

## 2022-02-03 MED ORDER — ARMC OPHTHALMIC DILATING DROPS
1.0000 | OPHTHALMIC | Status: DC | PRN
  Administered 2022-02-03 (×3): 1 via OPHTHALMIC

## 2022-02-03 MED ORDER — ACETAMINOPHEN 160 MG/5ML PO SOLN: 325.0000 mg | ORAL | Status: DC | PRN

## 2022-02-03 MED ORDER — ACETAMINOPHEN 325 MG PO TABS: 650.0000 mg | ORAL_TABLET | Freq: Once | ORAL | Status: DC | PRN

## 2022-02-03 MED ORDER — BRIMONIDINE TARTRATE-TIMOLOL 0.2-0.5 % OP SOLN
OPHTHALMIC | Status: DC | PRN
  Administered 2022-02-03: 1 [drp] via OPHTHALMIC

## 2022-02-03 SURGICAL SUPPLY — 15 items
CANNULA ANT/CHMB 27G (MISCELLANEOUS) IMPLANT
CANNULA ANT/CHMB 27GA (MISCELLANEOUS) IMPLANT
CATARACT SUITE SIGHTPATH (MISCELLANEOUS) ×1 IMPLANT
FEE CATARACT SUITE SIGHTPATH (MISCELLANEOUS) ×1 IMPLANT
GLOVE BIOGEL PI IND STRL 8 (GLOVE) ×1 IMPLANT
GLOVE SURG ENC TEXT LTX SZ8 (GLOVE) ×1 IMPLANT
LENS IOL TECNIS EYHANCE 16.5 (Intraocular Lens) IMPLANT
NDL FILTER BLUNT 18X1 1/2 (NEEDLE) ×1 IMPLANT
NEEDLE FILTER BLUNT 18X1 1/2 (NEEDLE) ×1 IMPLANT
PACK VIT ANT 23G (MISCELLANEOUS) IMPLANT
RING MALYGIN (MISCELLANEOUS) IMPLANT
SUT ETHILON 10-0 CS-B-6CS-B-6 (SUTURE)
SUTURE EHLN 10-0 CS-B-6CS-B-6 (SUTURE) IMPLANT
SYR 3ML LL SCALE MARK (SYRINGE) ×1 IMPLANT
WATER STERILE IRR 250ML POUR (IV SOLUTION) ×1 IMPLANT

## 2022-02-03 NOTE — Op Note (Signed)
PREOPERATIVE DIAGNOSIS:  Nuclear sclerotic cataract of the left eye.   POSTOPERATIVE DIAGNOSIS:  Nuclear sclerotic cataract of the left eye.   OPERATIVE PROCEDURE:ORPROCALL@   SURGEON:  Birder Robson, MD.   ANESTHESIA:  Anesthesiologist: Darrin Nipper, MD CRNA: Gigi Gin, CRNA  1.      Managed anesthesia care. 2.     0.38ml of Shugarcaine was instilled following the paracentesis   COMPLICATIONS:  None.   TECHNIQUE:   Stop and chop   DESCRIPTION OF PROCEDURE:  The patient was examined and consented in the preoperative holding area where the aforementioned topical anesthesia was applied to the left eye and then brought back to the Operating Room where the left eye was prepped and draped in the usual sterile ophthalmic fashion and a lid speculum was placed. A paracentesis was created with the side port blade and the anterior chamber was filled with viscoelastic. A near clear corneal incision was performed with the steel keratome. A continuous curvilinear capsulorrhexis was performed with a cystotome followed by the capsulorrhexis forceps. Hydrodissection and hydrodelineation were carried out with BSS on a blunt cannula. The lens was removed in a stop and chop  technique and the remaining cortical material was removed with the irrigation-aspiration handpiece. The capsular bag was inflated with viscoelastic and the Technis ZCB00 lens was placed in the capsular bag without complication. The remaining viscoelastic was removed from the eye with the irrigation-aspiration handpiece. The wounds were hydrated. The anterior chamber was flushed with BSS and the eye was inflated to physiologic pressure. 0.105ml Vigamox was placed in the anterior chamber. The wounds were found to be water tight. The eye was dressed with Combigan. The patient was given protective glasses to wear throughout the day and a shield with which to sleep tonight. The patient was also given drops with which to begin a drop regimen  today and will follow-up with me in one day. Implant Name Type Inv. Item Serial No. Manufacturer Lot No. LRB No. Used Action  LENS IOL TECNIS EYHANCE 16.5 - [id number] Intraocular Lens LENS IOL TECNIS EYHANCE 16.5 [id number] [employer]  Left 1 Implanted    Procedure(s): CATARACT EXTRACTION PHACO AND INTRAOCULAR LENS PLACEMENT (IOC) LEFT  13.11 01:09.5 (Left)  Electronically signed: Birder Robson 02/03/2022 9:07 AM

## 2022-02-03 NOTE — Transfer of Care (Signed)
Immediate Anesthesia Transfer of Care Note  Patient: Hannah Heath  Procedure(s) Performed: CATARACT EXTRACTION PHACO AND INTRAOCULAR LENS PLACEMENT (IOC) LEFT  13.11 01:09.5 (Left: Eye)  Patient Location: PACU  Anesthesia Type:MAC  Level of Consciousness: awake and alert   Airway & Oxygen Therapy: Patient Spontanous Breathing  Post-op Assessment: Report given to RN  Post vital signs: Reviewed  Last Vitals:  Vitals Value Taken Time  BP 151/85 02/03/22 0908  Temp 36.1 C 02/03/22 0908  Pulse 72 02/03/22 0909  Resp 17 02/03/22 0909  SpO2 97 % 02/03/22 0909  Vitals shown include unvalidated device data.  Last Pain:  Vitals:   02/03/22 0908  TempSrc:   PainSc: 0-No pain         Complications: No notable events documented.

## 2022-02-03 NOTE — H&P (Signed)
[facility]   Primary Care Physician:  Albina Billet, MD Ophthalmologist: Dr. George Ina  Pre-Procedure History & Physical: HPI:  Hannah Heath is a 82 y.o. female here for cataract surgery.   Past Medical History:  Diagnosis Date   GERD (gastroesophageal reflux disease)    Hypertension     Past Surgical History:  Procedure Laterality Date   CATARACT EXTRACTION W/PHACO Right 01/20/2022   Procedure: CATARACT EXTRACTION PHACO AND INTRAOCULAR LENS PLACEMENT (IOC) RIGHT 27.55 02:16.7;  Surgeon: Birder Robson, MD;  Location: [place];  Service: Ophthalmology;  Laterality: Right;   KNEE SURGERY Right     Prior to Admission medications   Medication Sig Start Date End Date Taking? Authorizing Provider  cetirizine (ZYRTEC) 10 MG tablet Take 5 mg by mouth daily.   Yes [provider]  diltiazem (CARDIZEM CD) 240 MG 24 hr capsule Take 240 mg by mouth daily. 09/20/20  Yes [provider]  fluticasone (FLONASE) 50 MCG/ACT nasal spray SMARTSIG:1 Both Nares Daily PRN 10/07/20  Yes [provider]  ibuprofen (ADVIL) 200 MG tablet Take 400 mg by mouth at bedtime.   Yes [provider]  lisinopril (ZESTRIL) 40 MG tablet Take 40 mg by mouth daily. 09/23/20  Yes [provider]  Melatonin 10 MG TABS Take by mouth at bedtime.   Yes [provider]  Multiple Vitamin (MULTIVITAMIN) tablet Take 1 tablet by mouth daily. Nature's truth   Yes [provider]  omeprazole (PRILOSEC) 20 MG capsule Take 20 mg by mouth daily.   Yes [provider]  pravastatin (PRAVACHOL) 40 MG tablet Take 40 mg by mouth daily as needed. 09/20/20  Yes [provider]    Allergies as of 12/15/2021 - Review Complete 10/15/2020  Allergen Reaction Noted   Codeine Itching 05/31/2013    Family History  Problem Relation Age of Onset   Breast cancer Neg Hx     Social History   Socioeconomic History   Marital status: Married     Spouse name: Not on file   Number of children: Not on file   Years of education: Not on file   Highest education level: Not on file  Occupational History   Not on file  Tobacco Use   Smoking status: Never   Smokeless tobacco: Never  Vaping Use   Vaping Use: Never used  Substance and Sexual Activity   Alcohol use: Never   Drug use: Never   Sexual activity: Not Currently  Other Topics Concern   Not on file  Social History Narrative   Not on file   Social Determinants of Health   Financial Resource Strain: Not on file  Food Insecurity: Not on file  Transportation Needs: Not on file  Physical Activity: Not on file  Stress: Not on file  Social Connections: Not on file  Intimate Partner Violence: Not on file    Review of Systems: See HPI, otherwise negative ROS  Physical Exam: BP (!) 177/88   Pulse 70   Temp 97.6 F (36.4 C) (Temporal)   Wt 72.6 kg   SpO2 96%   BMI 32.32 kg/m  General:   Alert, cooperative in NAD Head:  Normocephalic and atraumatic. Respiratory:  Normal work of breathing. Cardiovascular:  RRR  Impression/Plan: Hannah Heath is here for cataract surgery.  Risks, benefits, limitations, and alternatives regarding cataract surgery have been reviewed with the patient.  Questions have been answered.  All parties agreeable.   Birder Robson, MD  02/03/2022, 8:42 AM

## 2022-02-03 NOTE — Anesthesia Preprocedure Evaluation (Addendum)
Anesthesia Evaluation  Patient identified by MRN, date of birth, ID band Patient awake    Reviewed: Allergy & Precautions, NPO status , Patient's Chart, lab work & pertinent test results  History of Anesthesia Complications Negative for: history of anesthetic complications  Airway Mallampati: IV   Neck ROM: Full    Dental  (+) Missing   Pulmonary neg pulmonary ROS   Pulmonary exam normal breath sounds clear to auscultation       Cardiovascular hypertension, Normal cardiovascular exam Rhythm:Regular Rate:Normal     Neuro/Psych negative neurological ROS     GI/Hepatic ,GERD  Medicated and Controlled,,  Endo/Other  diabetes, Type 2  Obesity   Renal/GU Renal disease (nephrolithiasis)     Musculoskeletal  (+) Arthritis ,    Abdominal   Peds  Hematology negative hematology ROS (+)   Anesthesia Other Findings   Reproductive/Obstetrics                             Anesthesia Physical Anesthesia Plan  ASA: 2  Anesthesia Plan: MAC   Post-op Pain Management:    Induction: Intravenous  PONV Risk Score and Plan: 2 and Treatment may vary due to age or medical condition, Midazolam and TIVA  Airway Management Planned: Natural Airway and Nasal Cannula  Additional Equipment:   Intra-op Plan:   Post-operative Plan:   Informed Consent: I have reviewed the patients History and Physical, chart, labs and discussed the procedure including the risks, benefits and alternatives for the proposed anesthesia with the patient or authorized representative who has indicated his/her understanding and acceptance.     Dental advisory given  Plan Discussed with: CRNA  Anesthesia Plan Comments: (LMA/GETA backup discussed.  Patient consented for risks of anesthesia including but not limited to:  - adverse reactions to medications - damage to eyes, teeth, lips or other oral mucosa - nerve damage due to  positioning  - sore throat or hoarseness - damage to heart, brain, nerves, lungs, other parts of body or loss of life  Informed patient about role of CRNA in peri- and intra-operative care.  Patient voiced understanding.)       Anesthesia Quick Evaluation

## 2022-02-03 NOTE — Anesthesia Postprocedure Evaluation (Signed)
Anesthesia Post Note  Heath: Hannah Heath  Procedure(s) Performed: CATARACT EXTRACTION PHACO AND INTRAOCULAR LENS PLACEMENT (IOC) LEFT  13.11 01:09.5 (Left: Eye)  Heath location during evaluation: PACU Anesthesia Type: MAC Level of consciousness: awake and alert, oriented and Heath cooperative Pain management: pain level controlled Vital Signs Assessment: post-procedure vital signs reviewed and stable Respiratory status: spontaneous breathing, nonlabored ventilation and respiratory function stable Cardiovascular status: blood pressure returned to baseline and stable Postop Assessment: adequate PO intake Anesthetic complications: no   No notable events documented.   Last Vitals:  Vitals:   02/03/22 0908 02/03/22 0912  BP: (!) 151/85 (!) 155/87  Pulse: 71 73  Resp: 17 20  Temp: (!) 36.1 C (!) 36.1 C  SpO2: 98% 95%    Last Pain:  Vitals:   02/03/22 0912  TempSrc:   PainSc: 0-No pain                 Darrin Nipper

## 2022-02-05 ENCOUNTER — Encounter: Payer: Self-pay | Admitting: Ophthalmology

## 2023-06-13 IMAGING — US US RENAL
1 series · 14 of 25 positions shown · non-contrast
Comparison: CT abdomen pelvis 08/25/2018

CLINICAL DATA: Nephrolithiasis

EXAM:
RENAL / URINARY TRACT ULTRASOUND COMPLETE

[Series 1: us renal · 14 of 50 slices shown]
[im 1/50]
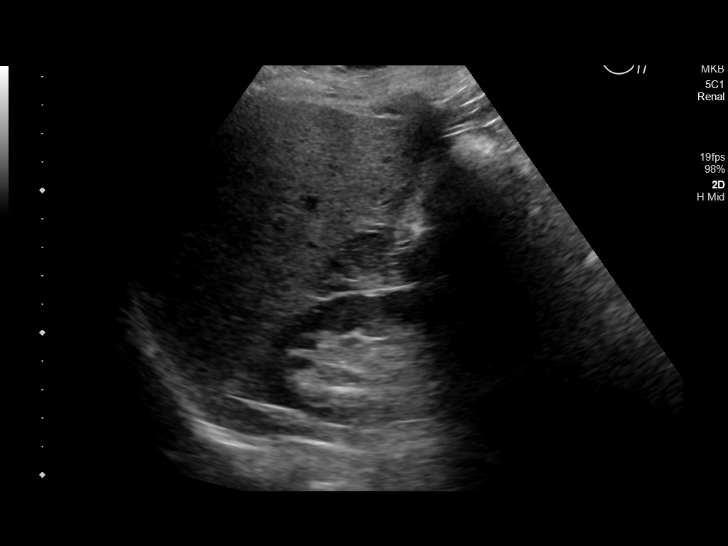
[im 5/50]
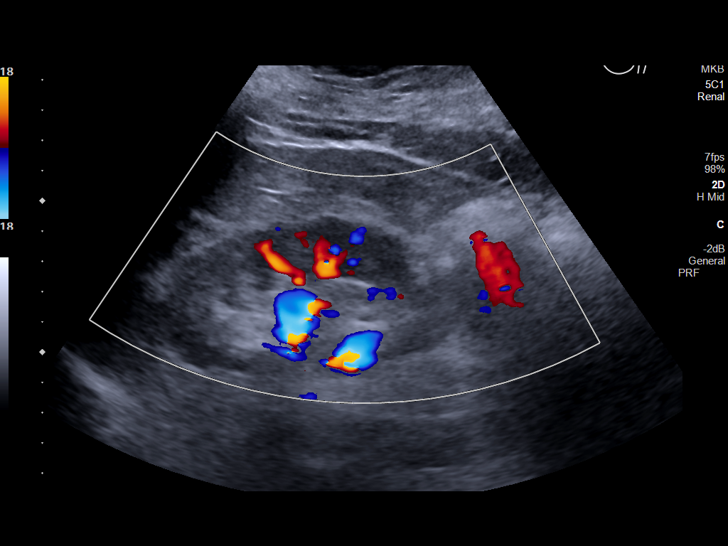
[im 9/50]
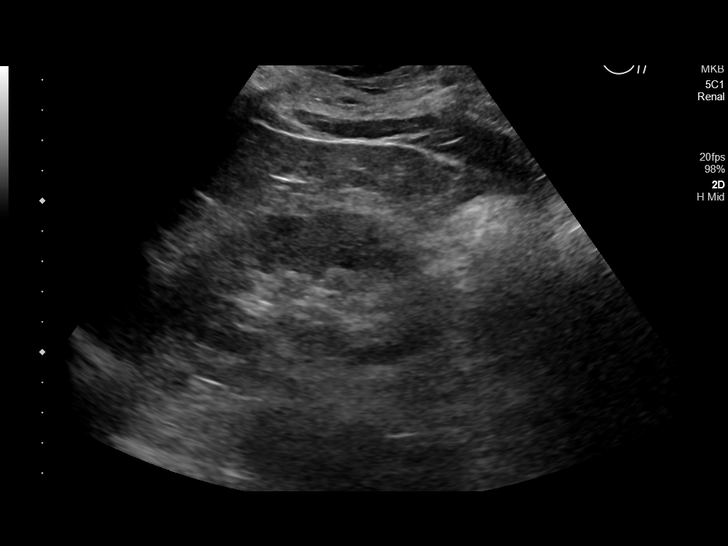
[im 13/50]
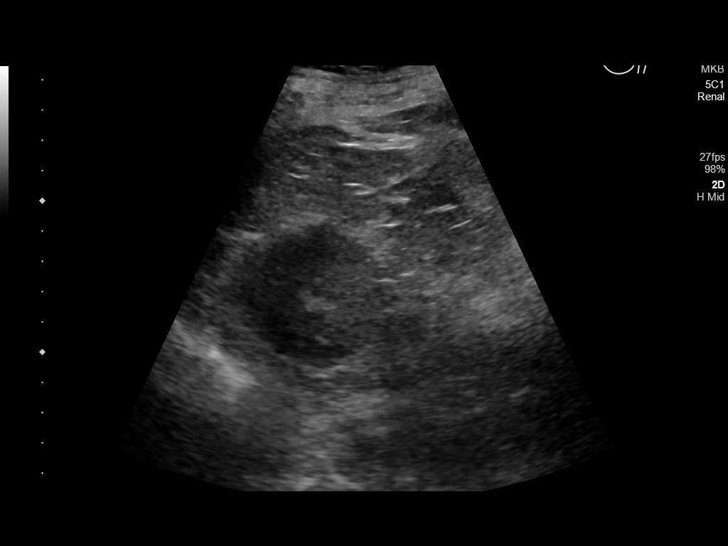
[im 17/50]
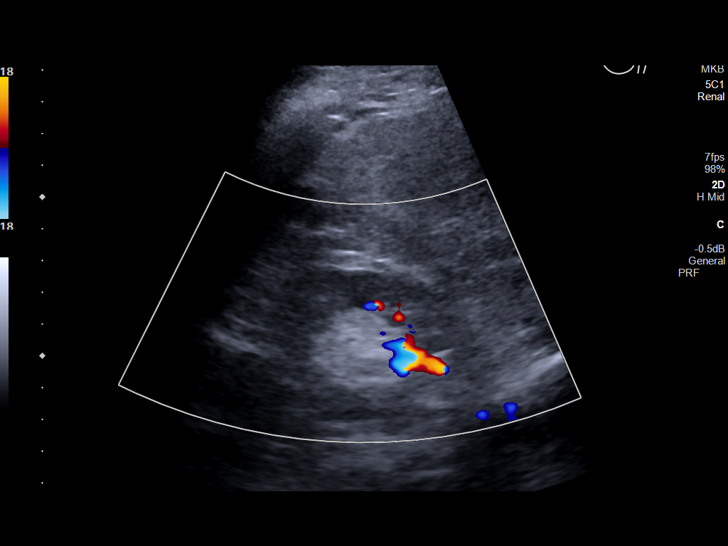
[im 19/50]
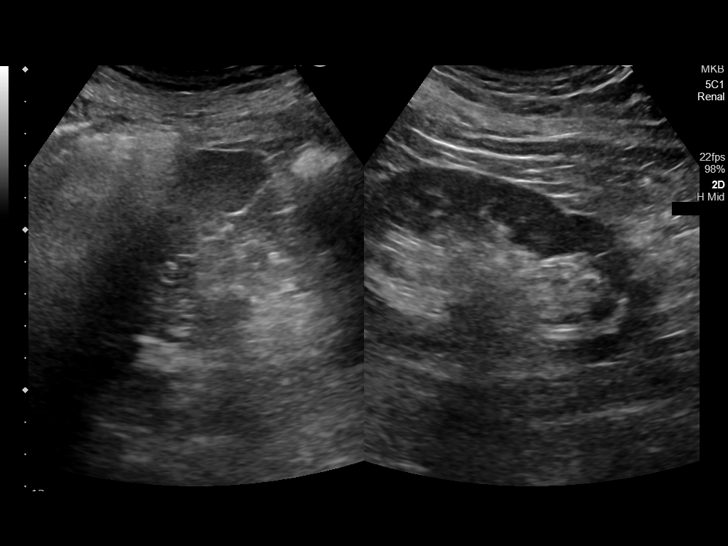
[im 23/50]
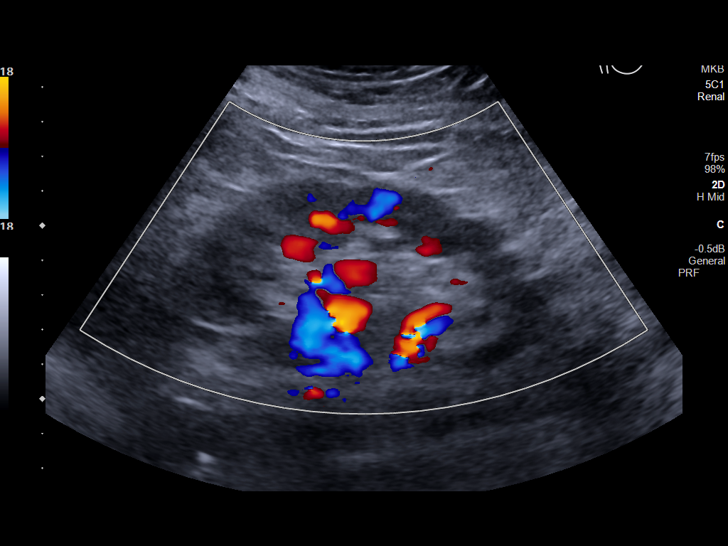
[im 27/50]
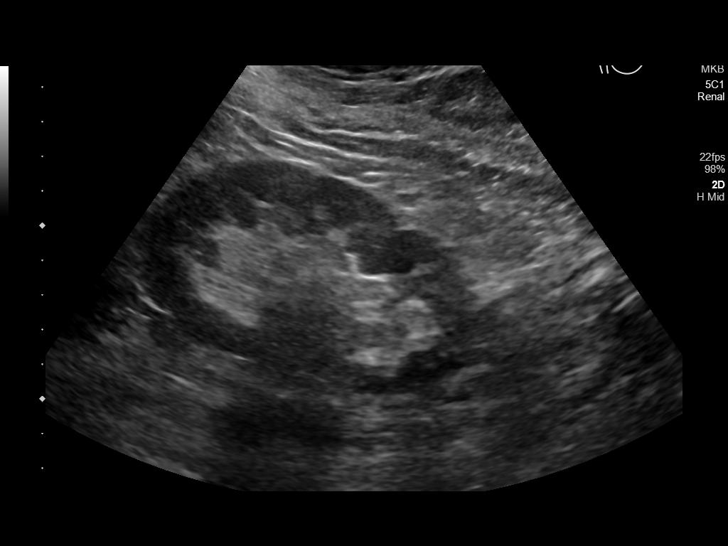
[im 31/50]
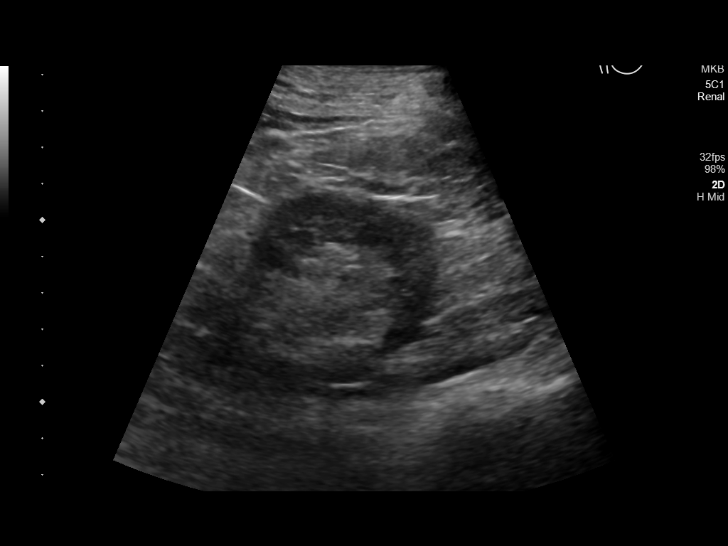
[im 33/50]
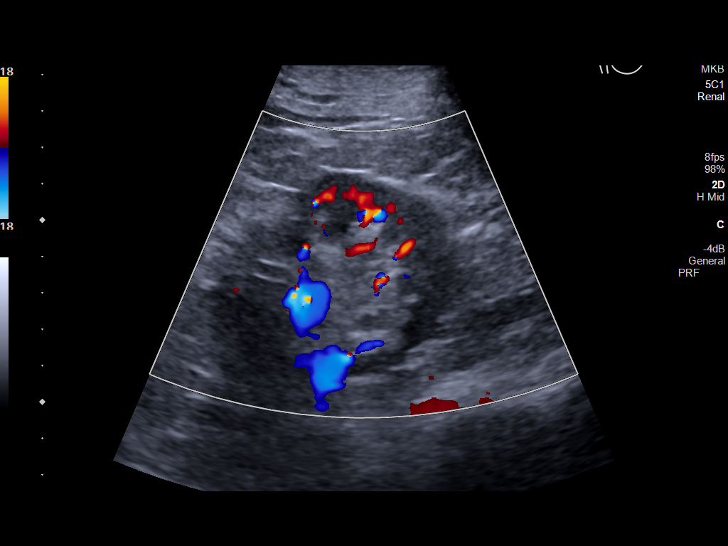
[im 37/50]
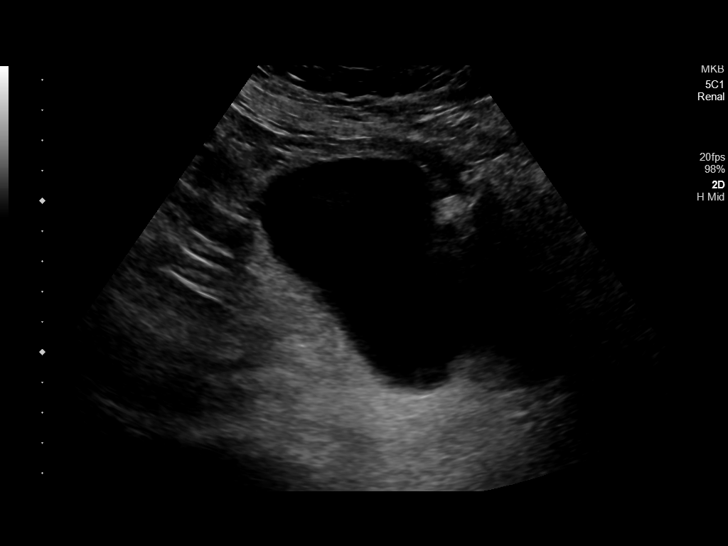
[im 41/50]
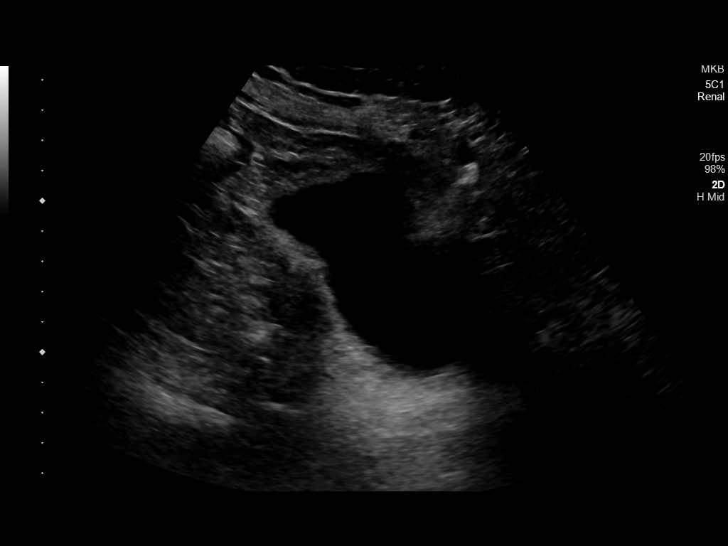
[im 45/50]
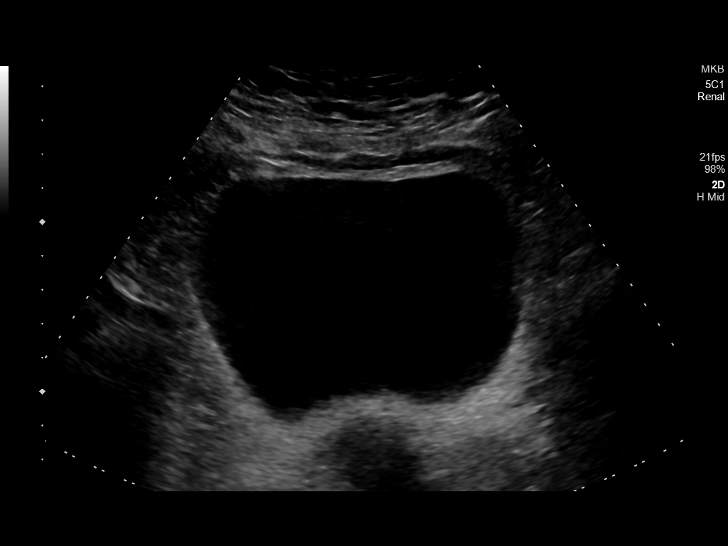
[im 50/50]
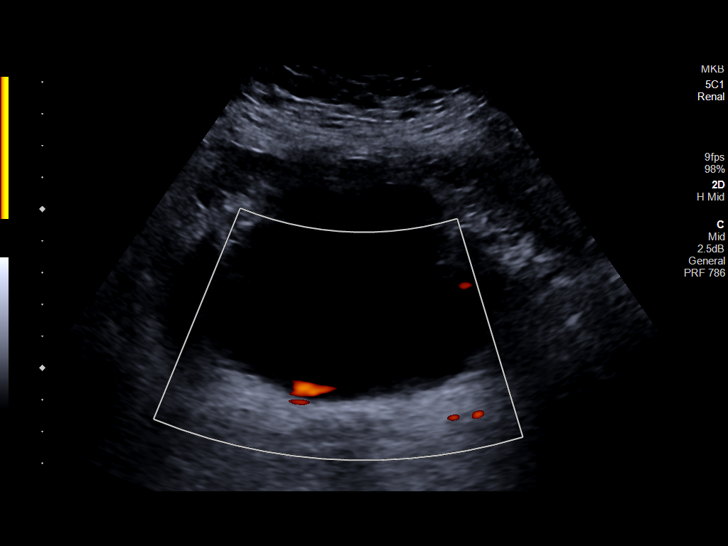

[14 of 25 positions shown; findings below may reference images not displayed]

FINDINGS: Right Kidney:

Renal measurements: 10.1 x 5.4 x 5.2 cm = volume: 151 mL.
Echogenicity within normal limits. No mass or hydronephrosis
visualized.

Left Kidney:

Renal measurements: 10.3 x 5.9 x 4.8 cm = volume: 151 mL.
Echogenicity within normal limits. No mass or hydronephrosis
visualized.

Bladder:

Appears normal for degree of bladder distention.

Other:

None.
IMPRESSION: Interval resolution of right hydronephrosis.

No significant sonographic abnormality of the kidneys.

## 2023-06-21 ENCOUNTER — Telehealth: Payer: Self-pay | Admitting: Internal Medicine

## 2023-06-21 NOTE — Telephone Encounter (Signed)
 LVM to get pt & husband scheduled a new pt appt with Dr. David Escort.

## 2023-09-14 ENCOUNTER — Encounter: Payer: Self-pay | Admitting: Internal Medicine

## 2023-09-14 DIAGNOSIS — Z13820 Encounter for screening for osteoporosis: Secondary | ICD-10-CM

## 2023-09-16 ENCOUNTER — Other Ambulatory Visit: Payer: Self-pay | Admitting: Internal Medicine

## 2023-09-16 DIAGNOSIS — Z13820 Encounter for screening for osteoporosis: Secondary | ICD-10-CM

## 2023-10-04 ENCOUNTER — Ambulatory Visit

## 2023-10-04 DIAGNOSIS — L821 Other seborrheic keratosis: Secondary | ICD-10-CM | POA: Diagnosis not present

## 2023-10-04 DIAGNOSIS — L814 Other melanin hyperpigmentation: Secondary | ICD-10-CM | POA: Diagnosis not present

## 2023-10-04 DIAGNOSIS — D1801 Hemangioma of skin and subcutaneous tissue: Secondary | ICD-10-CM

## 2023-10-04 DIAGNOSIS — C4491 Basal cell carcinoma of skin, unspecified: Secondary | ICD-10-CM

## 2023-10-04 DIAGNOSIS — C44311 Basal cell carcinoma of skin of nose: Secondary | ICD-10-CM

## 2023-10-04 DIAGNOSIS — W908XXA Exposure to other nonionizing radiation, initial encounter: Secondary | ICD-10-CM

## 2023-10-04 DIAGNOSIS — Z1283 Encounter for screening for malignant neoplasm of skin: Secondary | ICD-10-CM

## 2023-10-04 DIAGNOSIS — L578 Other skin changes due to chronic exposure to nonionizing radiation: Secondary | ICD-10-CM

## 2023-10-04 DIAGNOSIS — L570 Actinic keratosis: Secondary | ICD-10-CM

## 2023-10-04 DIAGNOSIS — D229 Melanocytic nevi, unspecified: Secondary | ICD-10-CM

## 2023-10-04 DIAGNOSIS — D234 Other benign neoplasm of skin of scalp and neck: Secondary | ICD-10-CM

## 2023-10-04 DIAGNOSIS — D489 Neoplasm of uncertain behavior, unspecified: Secondary | ICD-10-CM

## 2023-10-04 DIAGNOSIS — D239 Other benign neoplasm of skin, unspecified: Secondary | ICD-10-CM

## 2023-10-04 HISTORY — DX: Basal cell carcinoma of skin, unspecified: C44.91

## 2023-10-04 NOTE — Patient Instructions (Addendum)
 Biopsy Wound Care Instructions  Leave the original bandage on for 24 hours if possible.  If the bandage becomes soaked or soiled before that time, it is OK to remove it and examine the wound.  A small amount of post-operative bleeding is normal.  If excessive bleeding occurs, remove the bandage, place gauze over the site and apply continuous pressure (no peeking) over the area for 30 minutes. If this does not work, please call our clinic as soon as possible or page your doctor if it is after hours.   Once a day, cleanse the wound with soap and water. It is fine to shower. If a thick crust develops you may use a Q-tip dipped into dilute hydrogen peroxide (mix 1:1 with water) to dissolve it.  Hydrogen peroxide can slow the healing process, so use it only as needed.    After washing, apply petroleum jelly (Vaseline) or an antibiotic ointment if your doctor prescribed one for you, followed by a bandage.    For best healing, the wound should be covered with a layer of ointment at all times. If you are not able to keep the area covered with a bandage to hold the ointment in place, this may mean re-applying the ointment several times a day.  Continue this wound care until the wound has healed and is no longer open.   Itching and mild discomfort is normal during the healing process. However, if you develop pain or severe itching, please call our office.   If you have any discomfort, you can take Tylenol  (acetaminophen ) or ibuprofen as directed on the bottle. (Please do not take these if you have an allergy to them or cannot take them for another reason).  Some redness, tenderness and white or yellow material in the wound is normal healing.  If the area becomes very sore and red, or develops a thick yellow-green material (pus), it may be infected; please notify us .    If you have stitches, return to clinic as directed to have the stitches removed. You will continue wound care for 2-3 days after the stitches  are removed.   Wound healing continues for up to one year following surgery. It is not unusual to experience pain in the scar from time to time during the interval.  If the pain becomes severe or the scar thickens, you should notify the office.    A slight amount of redness in a scar is expected for the first six months.  After six months, the redness will fade and the scar will soften and fade.  The color difference becomes less noticeable with time.  If there are any problems, return for a post-op surgery check at your earliest convenience.  To improve the appearance of the scar, you can use silicone scar gel, cream, or sheets (such as Mederma or Serica) every night for up to one year. These are available over the counter (without a prescription).  Please call our office at [phone] for any questions or concerns.    Actinic keratoses are precancerous spots that appear secondary to cumulative UV radiation exposure/sun exposure over time. They are chronic with expected duration over 1 year. A portion of actinic keratoses will progress to squamous cell carcinoma of the skin. It is not possible to reliably predict which spots will progress to skin cancer and so treatment is recommended to prevent development of skin cancer.  Recommend daily broad spectrum sunscreen SPF 30+ to sun-exposed areas, reapply every 2 hours as needed.  Recommend staying in the shade or wearing long sleeves, sun glasses (UVA+UVB protection) and wide brim hats (4-inch brim around the entire circumference of the hat). Call for new or changing lesions.   Cryotherapy Aftercare  Wash gently with soap and water everyday.   Apply Vaseline and Band-Aid daily until healed.     Due to recent changes in healthcare laws, you may see results of your pathology and/or laboratory studies on MyChart before the doctors have had a chance to review them. We understand that in some cases there may be results that are confusing or  concerning to you. Please understand that not all results are received at the same time and often the doctors may need to interpret multiple results in order to provide you with the best plan of care or course of treatment. Therefore, we ask that you please give us  2 business days to thoroughly review all your results before contacting the office for clarification. Should we see a critical lab result, you will be contacted sooner.   If You Need Anything After Your Visit  If you have any questions or concerns for your doctor, please call our main line at [phone] and press option 4 to reach your doctor's medical assistant. If no one answers, please leave a voicemail as directed and we will return your call as soon as possible. Messages left after 4 pm will be answered the following business day.   You may also send us  a message via MyChart. We typically respond to MyChart messages within 1-2 business days.  For prescription refills, please ask your pharmacy to contact our office. Our fax number is [phone].  If you have an urgent issue when the clinic is closed that cannot wait until the next business day, you can page your doctor at the number below.    Please note that while we do our best to be available for urgent issues outside of office hours, we are not available 24/7.   If you have an urgent issue and are unable to reach us , you may choose to seek medical care at your doctor's office, retail clinic, urgent care center, or emergency room.  If you have a medical emergency, please immediately call 911 or go to the emergency department.  Pager Numbers  - Dr. Hester: [phone]  - Dr. Jackquline: [phone]  - Dr. Claudene: [phone]   - Dr. Raymund: [phone]  In the event of inclement weather, please call our main line at [phone] for an update on the status of any delays or closures.  Dermatology Medication Tips: Please keep the boxes that topical medications come  in in order to help keep track of the instructions about where and how to use these. Pharmacies typically print the medication instructions only on the boxes and not directly on the medication tubes.   If your medication is too expensive, please contact our office at [phone] option 4 or send us  a message through MyChart.   We are unable to tell what your co-pay for medications will be in advance as this is different depending on your insurance coverage. However, we may be able to find a substitute medication at lower cost or fill out paperwork to get insurance to cover a needed medication.   If a prior authorization is required to get your medication covered by your insurance company, please allow us  1-2 business days to complete this process.  Drug prices often vary depending on where the prescription is filled and some pharmacies may  offer cheaper prices.  The website www.goodrx.com contains coupons for medications through different pharmacies. The prices here do not account for what the cost may be with help from insurance (it may be cheaper with your insurance), but the website can give you the price if you did not use any insurance.  - You can print the associated coupon and take it with your prescription to the pharmacy.  - You may also stop by our office during regular business hours and pick up a GoodRx coupon card.  - If you need your prescription sent electronically to a different pharmacy, notify our office through [facility] or by phone at [phone] option 4.     Si Usted Necesita Algo Despus de Su Visita  Tambin puede enviarnos un mensaje a travs de Clinical cytogeneticist. Por lo general respondemos a los mensajes de MyChart en el transcurso de 1 a 2 das hbiles.  Para renovar recetas, por favor pida a su farmacia que se ponga en contacto con nuestra oficina. Randi lakes de fax es [place] [phone].  Si tiene un asunto urgente cuando la clnica est cerrada y que no puede  esperar hasta el siguiente da hbil, puede llamar/localizar a su doctor(a) al nmero que aparece a continuacin.   Por favor, tenga en cuenta que aunque hacemos todo lo posible para estar disponibles para asuntos urgentes fuera del horario de [place], no estamos disponibles las 24 horas del da, los 7 [address]  [address] [place].   Si tiene un problema urgente y no puede comunicarse con nosotros, puede optar por buscar atencin mdica  en el consultorio de su doctor(a), en una clnica privada, en un centro de atencin urgente o en una sala de emergencias.  Si tiene Engineer, drilling, por favor llame inmediatamente al 911 o vaya a la sala de emergencias.  Nmeros de bper  - Dr. Hester: [phone]  - Dra. Jackquline: [phone]  - Dr. Claudene: [phone]  - Dra. Kitts: [phone]  En caso de inclemencias del [place], por favor llame a nuestra lnea principal al [phone] para una actualizacin sobre el estado de cualquier retraso o cierre.  Consejos para la medicacin en dermatologa: Por favor, guarde las cajas en las que vienen los medicamentos de uso tpico para ayudarle a seguir las instrucciones sobre dnde y cmo usarlos. Las farmacias generalmente imprimen las instrucciones del medicamento slo en las cajas y no directamente en los tubos del [place].   Si su medicamento es muy caro, por favor, pngase en contacto con landry rieger llamando al [phone] y presione la opcin 4 o envenos un mensaje a travs de Clinical cytogeneticist.   No podemos decirle cul ser su copago por los medicamentos por adelantado ya que esto es diferente dependiendo de la cobertura de su seguro. Sin embargo, es posible que podamos encontrar un medicamento sustituto a Audiological scientist un formulario para que el seguro cubra el medicamento que se considera necesario.   Si se requiere una autorizacin previa para que su compaa de seguros malta su medicamento, por favor permtanos de 1 a 2 das hbiles para  completar este proceso.  Los precios de los medicamentos varan con frecuencia dependiendo del Environmental consultant de dnde se surte la receta y alguna farmacias pueden ofrecer precios ms baratos.  El sitio web www.goodrx.com tiene cupones para medicamentos de Health and safety inspector. Los precios aqu no tienen en cuenta lo que podra costar con la ayuda del seguro (puede ser ms barato con su seguro), pero el sitio web puede darle  el precio si no Visual merchandiser.  - Puede imprimir el cupn correspondiente y llevarlo con su receta a la farmacia.  - Tambin puede pasar por nuestra oficina durante el horario de atencin regular y Education officer, museum una tarjeta de cupones de GoodRx.  - Si necesita que su receta se enve electrnicamente a una farmacia diferente, informe a nuestra oficina a travs de MyChart de [place] o por telfono llamando al [phone] y presione la opcin 4.

## 2023-10-04 NOTE — Progress Notes (Signed)
 Subjective   Hannah Heath is a 83 y.o. female who presents for the following: Lesion(s) of concern . Patient is new patient  Today patient reports: Spots on nose and left temple that she noticed over a year ago. Reports that doesn't hurt or itch.    Review of Systems:    No other skin or systemic complaints except as noted in HPI or Assessment and Plan.  The following portions of the chart were reviewed this encounter and updated as appropriate: medications, allergies, medical history  Relevant Medical History:  Family history of skin cancer - in father   Objective  Well appearing patient in no apparent distress; mood and affect are within normal limits. Examination was performed of the: Face  Examination notable for: Angioma(s): Scattered red vascular papule(s)  , Lentigo/lentigines: Scattered pigmented macules that are tan to brown in color and are somewhat non-uniform in shape and concentrated in the sun-exposed areas, Nevus/nevi: Scattered well-demarcated, regular, pigmented macule(s) and/or papule(s)  , Seborrheic Keratosis(es): Stuck-on appearing keratotic papule(s) on the trunk, none  irritated with redness, crusting, edema, and/or partial avulsion, Actinic Damage/Elastosis: chronic sun damage: dyspigmentation, telangiectasia, and wrinkling, Actinic keratosis: Scaly erythematous macule(s) concentrated on sun exposed areas , ---  Examination limited by: Clothing and Patient deferred removal     3 blue nevi on scalp left nasal bridge 1.5 cm pink scaly plaque    right cheek x 1 Erythematous thin papules/macules with gritty scale.   Assessment & Plan   SKIN CANCER SCREENING PERFORMED TODAY.  BENIGN SKIN FINDINGS  - Lentigines  - Seborrheic keratoses  - Hemangiomas   - Nevus/Multiple Benign Nevi  - Blue nevi scalp x 3   - Reassurance provided regarding the benign appearance of lesions noted on exam today; no treatment is indicated in the absence of  symptoms/changes. - Reinforced importance of photoprotective strategies including liberal and frequent sunscreen use of a broad-spectrum SPF 30 or greater, use of protective clothing, and sun avoidance for prevention of cutaneous malignancy and photoaging.  Counseled patient on the importance of regular self-skin monitoring as well as routine clinical skin examinations as scheduled.   ACTINIC DAMAGE - Chronic condition, secondary to cumulative UV/sun exposure - Recommend daily broad spectrum sunscreen SPF 30+ to sun-exposed areas, reapply every 2 hours as needed.  - Staying in the shade or wearing long sleeves, sun glasses (UVA+UVB protection) and wide brim hats (4-inch brim around the entire circumference of the hat) are also recommended for sun protection.  - Call for new or changing lesions.   Procedures, orders, diagnosis for this visit:  NEOPLASM OF UNCERTAIN BEHAVIOR left nasal bridge Skin / nail biopsy Type of biopsy: tangential   Informed consent: discussed and consent obtained   Timeout: patient name, date of birth, surgical site, and procedure verified   Procedure prep:  Patient was prepped and draped in usual sterile fashion Prep type:  Isopropyl alcohol Anesthesia: the lesion was anesthetized in a standard fashion   Anesthetic:  1% lidocaine  w/ epinephrine  1-100,000 buffered w/ 8.4% NaHCO3 Instrument used: flexible razor blade   Hemostasis achieved with: pressure, aluminum chloride and electrodesiccation   Outcome: patient tolerated procedure well   Post-procedure details: sterile dressing applied and wound care instructions given   Dressing type: petrolatum and bandage    Specimen 1 - Surgical pathology Differential Diagnosis: ak vs scc   Check Margins: No Ak vs scc  SKIN EXAM FOR MALIGNANT NEOPLASM   LENTIGO   SEBORRHEIC KERATOSIS  CHERRY ANGIOMA   MULTIPLE BENIGN NEVI   ACTINIC SKIN DAMAGE   ACTINIC KERATOSIS right cheek x 1 Actinic keratoses are  precancerous spots that appear secondary to cumulative UV radiation exposure/sun exposure over time. They are chronic with expected duration over 1 year. A portion of actinic keratoses will progress to squamous cell carcinoma of the skin. It is not possible to reliably predict which spots will progress to skin cancer and so treatment is recommended to prevent development of skin cancer.  Recommend daily broad spectrum sunscreen SPF 30+ to sun-exposed areas, reapply every 2 hours as needed.  Recommend staying in the shade or wearing long sleeves, sun glasses (UVA+UVB protection) and wide brim hats (4-inch brim around the entire circumference of the hat). Call for new or changing lesions. Destruction of lesion - right cheek x 1 Complexity: simple   Destruction method: cryotherapy   Informed consent: discussed and consent obtained   Timeout:  patient name, date of birth, surgical site, and procedure verified Lesion destroyed using liquid nitrogen: Yes   Region frozen until ice ball extended beyond lesion: Yes   Outcome: patient tolerated procedure well with no complications   Post-procedure details: wound care instructions given    BLUE NEVUS    Neoplasm of uncertain behavior -     Skin / nail biopsy -     Surgical pathology; Standing  Skin exam for malignant neoplasm  Lentigo  Seborrheic keratosis  Cherry angioma  Multiple benign nevi  Actinic skin damage  Actinic keratosis -     Destruction of lesion  Blue nevus    Return to clinic: Return if symptoms worsen or fail to improve.  Documentation: I have reviewed the above documentation for accuracy and completeness, and I agree with the above.  Lauraine JAYSON Kanaris, MD

## 2023-10-05 LAB — SURGICAL PATHOLOGY

## 2023-10-06 ENCOUNTER — Ambulatory Visit: Payer: Self-pay

## 2023-10-06 DIAGNOSIS — C44311 Basal cell carcinoma of skin of nose: Secondary | ICD-10-CM

## 2023-10-06 NOTE — Telephone Encounter (Signed)
 Referral placed to Dr. Corey with information to postpone appointment per patient's request. aw

## 2023-10-06 NOTE — Telephone Encounter (Signed)
 Patient was made aware of BX results and discussed MOHs procedure. 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  Patient is asking can you suggest any alternative treatment?

## 2023-11-16 ENCOUNTER — Encounter: Admitting: Dermatology

## 2024-01-19 ENCOUNTER — Ambulatory Visit: Admitting: Dermatology

## 2024-01-19 ENCOUNTER — Encounter: Payer: Self-pay | Admitting: Dermatology

## 2024-01-19 VITALS — BP 125/69 | HR 81 | Temp 98.0°F

## 2024-01-19 DIAGNOSIS — C4491 Basal cell carcinoma of skin, unspecified: Secondary | ICD-10-CM

## 2024-01-19 MED ORDER — OXYCODONE HCL 5 MG PO TABS: 5.0000 mg | ORAL_TABLET | Freq: Four times a day (QID) | ORAL | 0 refills | Status: AC | PRN

## 2024-01-19 NOTE — Patient Instructions (Signed)

## 2024-01-19 NOTE — Progress Notes (Addendum)
 "  Follow-Up Visit   Subjective  Hannah Heath is a 84 y.o. 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 Raymund.  The following portions of the chart were reviewed this encounter and updated as appropriate: medications, allergies, medical history  Review of Systems:  No other skin or systemic complaints except as noted in HPI or Assessment and Plan.  Objective  Well appearing patient in no apparent distress; mood and affect are within normal limits.  A focused examination was performed of the following areas: Left nasal bridge Relevant physical exam findings are noted in the Assessment and Plan.   left nasal bridge Pink scaly plaque   Assessment & Plan   BASAL CELL CARCINOMA (BCC), UNSPECIFIED SITE left nasal bridge - Mohs surgery  Consent obtained: written  Anticoagulation: Is the patient taking prescription anticoagulant and/or aspirin prescribed/recommended by a physician? No   Was the anticoagulation regimen changed prior to Mohs? No    Anesthesia: Anesthesia method: local infiltration Local anesthetic: lidocaine  1% WITH epi  Procedure Details: Timeout: pre-procedure verification complete Procedure Prep: patient was prepped and draped in usual sterile fashion Prep type: chlorhexidine Biopsy accession number: [id number] Pre-Op diagnosis: basal cell carcinoma BCC subtype: nodular MohsAIQ Surgical site (if tumor spans multiple areas, please select predominant area): nose Surgery side: left Surgical site (from skin exam): left nasal bridge Pre-operative length (cm): 1.4 Pre-operative width (cm): 1.2 Indications for Mohs surgery: anatomic location where tissue conservation is critical  Micrographic Surgery Details: Post-operative length (cm): 3 Post-operative width (cm): 2.5 Number of Mohs stages: 3 Post surgery depth of defect: dermis and skeletal muscle  Stage 1    Tumor features  identified on Mohs section: basal carcinoma    Depth of tumor invasion after stage: dermis and subcutaneous fat  Stage 2    Tumor features identified on Mohs section: basal carcinoma    Depth of tumor invasion after stage: dermis and subcutaneous fat  Stage 3    Tumor features identified on Mohs section: no tumor identified    Depth of tumor invasion after stage: dermis  Reconstruction: Was the defect reconstructed? Yes   Was reconstruction performed by the same Mohs surgeon? Yes   Setting of reconstruction: outpatient office When was reconstruction performed? same day Type of reconstruction: flap  This Visit - oxyCODONE  (OXY IR/ROXICODONE ) 5 MG immediate release tablet - Take 1 tablet (5 mg total) by mouth every 6 (six) hours as needed for up to 8 doses.   Return in 4 weeks (on 02/16/2024) for wound check, 11 days wound check suture removal.  I, Darice Smock, CMA, am acting as scribe for Hannah CHRISTELLA HOLY, MD.    01/19/2024  HISTORY OF PRESENT ILLNESS  Hannah Heath is seen in consultation at the request of Dr. 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 They note that the area has been present for about 1 year increasing in size with time.  There is no history of previous treatment.  Reports no other new or changing lesions and has no other complaints today.  Medications and allergies: see patient chart.  Review of systems: Reviewed 8 systems and notable for the above skin cancer.  All other systems reviewed are unremarkable/negative, unless noted in the HPI. Past medical history, surgical history, family history, social history were also reviewed and are noted in the chart/questionnaire.    PHYSICAL EXAMINATION  General: Well-appearing, in  no acute distress, alert and oriented x 4. Vitals reviewed in chart (if available).   Skin: Exam reveals a 1.4 x 1.2 cm erythematous papule and biopsy scar on the left nasal bridge. There are rhytids,  telangiectasias, and lentigines, consistent with photodamage.   Biopsy report(s) reviewed, confirming the diagnosis.   ASSESSMENT  1) Nodular Basal Cell Carcinoma of the nasal dorsum 2) photodamage 3) solar lentigines   PLAN   1. Due to location, size, histology, or recurrence and the likelihood of subclinical extension as well as the need to conserve normal surrounding tissue, the patient was deemed acceptable for Mohs micrographic surgery (MMS).  The nature and purpose of the procedure, associated benefits and risks including recurrence and scarring, possible complications such as pain, infection, and bleeding, and alternative methods of treatment if appropriate were discussed with the patient during consent. The lesion location was verified by the patient, by reviewing previous notes, pathology reports, and by photographs as well as angulation measurements if available.  Informed consent was reviewed and signed by the patient, and timeout was performed at 9:00 AM. See op note below.  2. For the photodamage and solar lentigines, sun protection discussed/information given on OTC sunscreens, and we recommend continued regular follow-up with primary dermatologist every 6 months or sooner for any growing, bleeding, or changing lesions. 3. Prognosis and future surveillance discussed. 4. Letter with treatment outcome sent to referring provider. 5. Pain acetaminophen /ibuprofen/oxycodone  5 mg  MOHS MICROGRAPHIC SURGERY AND RECONSTRUCTION  Initial size:   1.4 x 1.2 cm Surgical defect/wound size: 3.0 x 2.5 cm Anesthesia:    0.33% lidocaine  with 1:200,000 epinephrine  EBL:    <5 mL Complications:  None Repair type:   Tunneled Island Pedicle Flap SQ suture:   5-0 Monocryl, 4-0 Monocryl Cutaneous suture:  5-0 Polyprolene Final size of the repair: 7.7 x 3.0 = 23.1 cm^2  Stages:  3   STAGE I: Anesthesia achieved with 0.5% lidocaine  with 1:200,000 epinephrine . ChloraPrep applied. 1 section(s)  excised using Mohs technique (this includes total peripheral and deep tissue margin excision and evaluation with frozen sections, excised and interpreted by the same physician). The tumor was first debulked and then excised with an approx. 2 mm margin.  Hemostasis was achieved with electrocautery as needed.  The specimen was then oriented, subdivided/relaxed, inked, and processed using Mohs technique.    Frozen section analysis revealed a positive margin for islands of cells with peripheral palisading and a haphazard arrangement of the more central cells in the peripheral margin.    STAGE II: An additional 2 mm margin was excised.  Hemostasis was achieved with electrocautery as needed.  The specimen was then oriented, subdivided/relaxed, inked, and processed using Mohs technique.   Frozen section analysis revealed a positive margin for  multiple, small buds of basaloid cells descending from the epidermis with no dermal invasion in the peripheral margin.  STAGE III: An additional 2 mm margin was excised.  Hemostasis was achieved with electrocautery as needed.  The specimen was then oriented, subdivided/relaxed, inked, and processed using Mohs technique. Evaluation of slides by the Mohs surgeon revealed clear tumor margins.   Reconstruction  Tunneled Island Pedicle Flap from the the Forehead with Axial Vessels of the Supratrochlear Artery A tunneled island pedicle flap was required for reconstruction of the nasal dorsum due to the size and location of the defect and inability to achieve primary closure. An island pedicle flap was designed on the forehead and elevated on a robust subcutaneous vascular pedicle from  the supratrochlear artery. The flap measured 7.7  3.0 cm, exceeding the dimensions of the defect, and required extensive undermining of surrounding tissues to allow adequate mobilization. The flap was tunneled to the nasal dorsum and rotated 180 degrees on its pedicle to achieve proper  positioning, contour, and tension-free inset. This complex adjacent tissue transfer, including flap elevation, extensive undermining, tunneling, and pedicle rotation, is consistent with and supports reporting CPT 15740.   Documentation: I have reviewed the above documentation for accuracy and completeness, and I agree with the above.  Hannah CHRISTELLA HOLY, MD  "

## 2024-01-31 ENCOUNTER — Ambulatory Visit: Admitting: Dermatology

## 2024-01-31 ENCOUNTER — Encounter: Payer: Self-pay | Admitting: Dermatology

## 2024-01-31 DIAGNOSIS — C4491 Basal cell carcinoma of skin, unspecified: Secondary | ICD-10-CM

## 2024-01-31 DIAGNOSIS — T1490XD Injury, unspecified, subsequent encounter: Secondary | ICD-10-CM

## 2024-01-31 NOTE — Patient Instructions (Signed)

## 2024-01-31 NOTE — Progress Notes (Signed)
" ° °  Follow Up Visit   Subjective  Hannah Heath is a 84 y.o. female who presents for the following: follow up from Mohs surgery   The patient presents for follow up from Mohs surgery for a BCC on the left nasal bridge, treated on 01/19/24, repaired with tunneled island pedicle forehead flap. The patient has been bandaging the wound as directed. The endorse the following concerns: none  The following portions of the chart were reviewed this encounter and updated as appropriate: medications, allergies, medical history  Review of Systems:  No other skin or systemic complaints except as noted in HPI or Assessment and Plan.  Objective  Well appearing patient in no apparent distress; mood and affect are within normal limits.  A focal examination was performed including scalp, head, face. All findings within normal limits unless otherwise noted below.  Healing wound with mild erythema  Relevant physical exam findings are noted in the Assessment and Plan.       Assessment & Plan    Healing Wound s/p Mohs for Hannah Heath on the nasal dorsum, treated on 01/19/24, repaired with a tunneled island pedicle forehead flap - Reassured that wound is healing well - No evidence of infection - No swelling, induration, purulence, dehiscence, or tenderness out of proportion to the clinical exam, see photo above - Discussed that scars take up to 12 months to mature from the date of surgery - Recommend SPF 30+ to scar daily to prevent purple color from UV exposure during scar maturation process - Discussed that erythema and raised appearance of scar will fade over the next 4-6 months - OK to start scar massage at 4-6 weeks post-op - Can consider silicone based products for scar healing starting at 6 weeks post-op - Ok to continue ointment daily to wound under a bandage for another week  HISTORY OF BASAL CELL CARCINOMA OF THE SKIN - No evidence of recurrence today - Recommend regular full body skin exams -  Recommend daily broad spectrum sunscreen SPF 30+ to sun-exposed areas, reapply every 2 hours as needed.  - Call if any new or changing lesions are noted between office visits  Return in about 3 weeks (around 02/21/2024) for wound check.  I, Darice Smock, CMA, am acting as scribe for Hannah CHRISTELLA HOLY, MD.   Documentation: I have reviewed the above documentation for accuracy and completeness, and I agree with the above.  Hannah CHRISTELLA HOLY, MD  "

## 2024-02-24 ENCOUNTER — Ambulatory Visit: Admitting: Dermatology
